# Patient Record
Sex: Male | Born: 2000
Health system: Southern US, Community
[De-identification: ages and names within clinical notes are randomized; demographics above are authoritative.]

## PROBLEM LIST (undated history)

## (undated) DIAGNOSIS — Z21 Asymptomatic human immunodeficiency virus [HIV] infection status: Secondary | ICD-10-CM

## (undated) DIAGNOSIS — B20 Human immunodeficiency virus [HIV] disease: Secondary | ICD-10-CM

---
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
## 2018-01-05 ENCOUNTER — Ambulatory Visit: Payer: Medicaid Other | Admitting: Licensed Clinical Social Worker

## 2018-01-05 DIAGNOSIS — F32A Depression, unspecified: Secondary | ICD-10-CM

## 2018-01-05 DIAGNOSIS — F329 Major depressive disorder, single episode, unspecified: Secondary | ICD-10-CM

## 2018-01-05 NOTE — Progress Notes (Signed)
Type of Service: Integrated Behavioral Health 1 of 6 F/U Visits Total time:30 minutes :  Interpreter:No.    Reason for follow-up: Continue brief intervention to assist patient with managing symptoms of anxiety and depression, as well as work on behavioral change . Reports somewhat difficulty in daily functions. Patient is pleasant and engaged in Rad[place]con[place]ation.   Appearance:Neat ; Thought process: Coherent; Affect: Appropriate: No plan to harm self or others  Patient is making progress towards goal.  States he is feeling a little better and has decrease changes in his behavior to once per day. This is also evident by slight decrease in PHQ-9 score.  Patient continues to have difficulty with sleep, poor appetite, low energy and difficulty relaxing.  States he has implemented relaxed breathing which has helped a lot. GAD 7 : Generalized Anxiety Score 01/05/2018  Nervous, Anxious, on Edge 2  Control/stop worrying 3  Worry too much - different things 3  Trouble relaxing 3  Restless 2  Easily annoyed or irritable 2  Afraid - awful might happen 3  Total GAD 7 Score 18  Anxiety Difficulty Somewhat difficult   [name] 01/05/2018 12/29/2017 12/29/2017  Decreased Interest 2 2 0  Down, Depressed, Hopeless 2 3 0  PHQ - 2 Score 4 5 0  Altered sleeping 3 3 -  Tired, decreased energy 3 3 -  Change in appetite 3 2 -  Feeling bad or failure about yourself  2 3 -  Trouble concentrating 2 2 -  Moving slowly or fidgety/restless 1 2 -  Suicidal thoughts 0 0 -  PHQ-9 Score 18 20 -  Difficult doing work/chores Somewhat difficult Somewhat difficult -   Goals: Patient will  1. reduce symptoms of: anxiety, depression and mood instability ,  2. increase  ability RU:[id number]:coping skills, healthy habits, self-management skills and stress reduction,  3. Decrease self-medicating behaviors. 4. Will start exercise program Intervention: Solution-Focused Strategies, Behavioral Activation and Sleep Hygiene,  Reflective listening, Behavioral Therapy (Relaxed breathing); Psychoeducation . Consult with PCP for possible melatonin to assist with sleep concerns. Issues discussed: goals accomplished from previous visit  ; how change impacts behavior ; how he is able to eat more; sleeping patterns and review of sleep hygiene check list. Assessment:Patient continues to experience symptoms of anxiety and depression.  Symptoms exacerbated by self-medicating behavior, and difficulty with managing anxiety.  Patient may benefit from, and is in agreement to continue further assessment and brief therapeutic interventions to assist with managing his symptoms and behavior change.  Plan: LCSW will continue brief intervention during next office visit. Will adjust interventions based on progress. Patient will work on the plan below prior to next office visit.  1. Patient will F/U with LCSW in 3 weeks and keep appointment with PCP. 2. Behavioral recommendations: continue relaxed breathing, start sleep hygiene, and continue with reduction in behavior change. 3. Referral: none at this time  Sammuel Hineseborah Moore, LCSW Licensed Clinical Social Worker [facility]   [phone] 10:51 AM

## 2018-01-25 ENCOUNTER — Encounter: Payer: Self-pay | Admitting: Family Medicine

## 2018-01-25 ENCOUNTER — Ambulatory Visit: Payer: Medicaid Other

## 2018-01-26 ENCOUNTER — Ambulatory Visit: Payer: Medicaid Other

## 2018-02-11 ENCOUNTER — Encounter (HOSPITAL_COMMUNITY): Payer: Self-pay | Admitting: Emergency Medicine

## 2018-02-11 ENCOUNTER — Ambulatory Visit (HOSPITAL_COMMUNITY)
Admission: EM | Admit: 2018-02-11 | Discharge: 2018-02-11 | Disposition: A | Discharge status: Home / Self Care | Payer: Medicaid Other | Attending: Internal Medicine | Admitting: Internal Medicine

## 2018-02-11 DIAGNOSIS — R1110 Vomiting, unspecified: Secondary | ICD-10-CM | POA: Diagnosis not present

## 2018-02-11 DIAGNOSIS — E860 Dehydration: Secondary | ICD-10-CM | POA: Insufficient documentation

## 2018-02-11 DIAGNOSIS — R1115 Cyclical vomiting syndrome unrelated to migraine: Secondary | ICD-10-CM

## 2018-02-11 LAB — COMPREHENSIVE METABOLIC PANEL
ALT: 20 U/L (ref 0–44)
ANION GAP: 11 (ref 5–15)
AST: 26 U/L (ref 15–41)
Albumin: 4.4 g/dL (ref 3.5–5.0)
Alkaline Phosphatase: 53 U/L (ref 38–126)
BILIRUBIN TOTAL: 1.4 mg/dL — AB (ref 0.3–1.2)
BUN: 13 mg/dL (ref 6–20)
CALCIUM: 9.3 mg/dL (ref 8.9–10.3)
CO2: 21 mmol/L — ABNORMAL LOW (ref 22–32)
Chloride: 111 mmol/L (ref 98–111)
Creatinine, Ser: 1.06 mg/dL (ref 0.61–1.24)
GFR calc Af Amer: >60 mL/min (ref >60)
GFR calc non Af Amer: >60 mL/min (ref >60)
Glucose, Bld: 99 mg/dL (ref 70–99)
POTASSIUM: 3.5 mmol/L (ref 3.5–5.1)
Sodium: 143 mmol/L (ref 135–145)
Total Protein: 7.2 g/dL (ref 6.5–8.1)

## 2018-02-11 LAB — CBC WITH DIFFERENTIAL/PLATELET
Basophils Absolute: 0 10*3/uL (ref 0.0–0.1)
Basophils Relative: 0 %
EOS PCT: 0 %
Eosinophils Absolute: 0 10*3/uL (ref 0.0–0.7)
HEMATOCRIT: 45 % (ref 39.0–52.0)
Hemoglobin: 14.4 g/dL (ref 13.0–17.0)
LYMPHS ABS: 2.4 10*3/uL (ref 0.7–4.0)
Lymphocytes Relative: 45 %
MCH: 26 pg (ref 26.0–34.0)
MCHC: 32 g/dL (ref 30.0–36.0)
MCV: 81.2 fL (ref 78.0–100.0)
MONOS PCT: 8 %
Monocytes Absolute: 0.4 10*3/uL (ref 0.1–1.0)
NEUTROS ABS: 2.5 10*3/uL (ref 1.7–7.7)
Neutrophils Relative %: 47 %
Platelets: 121 10*3/uL — ABNORMAL LOW (ref 150–400)
RBC: 5.54 MIL/uL (ref 4.22–5.81)
RDW: 13.1 % (ref 11.5–15.5)
WBC: 5.3 10*3/uL (ref 4.0–10.5)

## 2018-02-11 LAB — LIPASE, BLOOD: Lipase: 25 U/L (ref 11–51)

## 2018-02-11 LAB — AMYLASE: Amylase: 37 U/L (ref 28–100)

## 2018-02-11 MED ORDER — ONDANSETRON HCL 4 MG/2ML IJ SOLN
4.0000 mg | Freq: Once | INTRAMUSCULAR | Status: AC
  Administered 2018-02-11: 4 mg via INTRAVENOUS

## 2018-02-11 MED ORDER — METOCLOPRAMIDE HCL 5 MG/ML IJ SOLN
INTRAMUSCULAR | Status: AC
  Filled 2018-02-11: qty 2

## 2018-02-11 MED ORDER — METOCLOPRAMIDE HCL 5 MG/ML IJ SOLN
10.0000 mg | Freq: Once | INTRAMUSCULAR | Status: AC
  Administered 2018-02-11: 10 mg via INTRAVENOUS

## 2018-02-11 MED ORDER — ONDANSETRON HCL 4 MG/2ML IJ SOLN
INTRAMUSCULAR | Status: AC
  Filled 2018-02-11: qty 2

## 2018-02-11 MED ORDER — SODIUM CHLORIDE 0.9 % IV BOLUS
1000.0000 mL | Freq: Once | INTRAVENOUS | Status: AC
  Administered 2018-02-11: 1000 mL via INTRAVENOUS

## 2018-02-11 NOTE — ED Provider Notes (Signed)
[facility]    CSN: [id number] Arrival date & time: 02/11/18  1224     History   Chief Complaint Chief Complaint  Patient presents with  . Emesis    HPI Matthew Petersen is a 17 y.o. male.   He presents today with 3-day history of vomiting, upper abdominal discomfort, seems to radiate to the left flank/abdomen.  Feels sore from throwing up.  Central chest discomfort after vomiting.  Not having diarrhea.  No hematuria.  Vomiting during exam.  Similar symptoms a month ago, seen in ED for this, differential dx included cannabinoid hyperemesis.  Acknowledges smoking weed, denies vaping. Some runny nose, equivocal cough, no sore throat.  Not febrile.  Slightly diaphoretic.  HPI  History reviewed. No pertinent past medical history.  Patient Active Problem List   Diagnosis Date Noted  . Mood disorder of depressed type 12/29/2017  . Loss of weight 12/29/2017  . Exposure to STD 12/22/2017    History reviewed. No pertinent surgical history.     Home Medications    Prior to Admission medications   Medication Sig Start Date End Date Taking? Authorizing Provider  capsicum (ZOSTRIX) 0.075 % topical cream Apply 1 application topically 2 (two) times daily as needed (pain).    [provider]  diphenhydramine-acetaminophen (TYLENOL PM) 25-500 MG TABS tablet Take 2 tablets by mouth at bedtime as needed (sleep/pain).    [provider]  ondansetron (ZOFRAN) 4 MG tablet Take 1 tablet (4 mg total) by mouth every 8 (eight) hours as needed for up to 10 days for nausea or vomiting. 02/12/18 02/22/18  Mortis, Jerrel Ivory I, PA-C  potassium chloride SA (K-DUR,KLOR-CON) 20 MEQ tablet Take 1 tablet (20 mEq total) by mouth daily. 02/12/18 03/14/18  Mortis, Sharyon Medicus, PA-C    Family History No family history on file.  Social History Social History   Tobacco Use  . Smoking status: Smoker, Current Status Unknown  . Smokeless tobacco: Never Used  Substance Use Topics    . Alcohol use: Yes    Comment: 12/27/2017 last usage  . Drug use: Yes    Types: Marijuana    Comment: last used 02/05/2018     Allergies   Patient has no known allergies.   Review of Systems Review of Systems  All other systems reviewed and are negative.    Physical Exam Triage Vital Signs ED Triage Vitals [02/11/18 1250]  Enc Vitals Group     BP 132/74     Pulse Rate 60     Resp 18     Temp 98.3 F (36.8 C)     Temp src      SpO2 100 %     Weight      Height      Pain Score      Pain Loc    Updated Vital Signs BP 132/74   Pulse 60   Temp 98.3 F (36.8 C)   Resp 18   SpO2 100%  Physical Exam  Constitutional: He is oriented to person, place, and time.  Alert, miserable Frequent hard vomiting/retching   HENT:  Head: Atraumatic.  Eyes:  Conjugate gaze, no eye redness/drainage  Neck: Neck supple.  Cardiovascular: Normal rate and regular rhythm.  Pulmonary/Chest: No respiratory distress. He has no wheezes. He has no rales.  Lungs clear, symmetric breath sounds  Abdominal: Soft.  Mod tenderness in epigastrium No rebound, does not guard Pain radiates to L abdomen/flank but no focal tenderness on the left.  Musculoskeletal: Normal range of motion.  Neurological: He is alert and oriented to person, place, and time.  Skin: Skin is warm and dry.  No cyanosis  Nursing note and vitals reviewed.    UC Treatments / Results  Labs Results for orders placed or performed during the hospital encounter of 02/11/18  CBC with Differential  Result Value Ref Range   WBC 5.3 4.0 - 10.5 K/uL   RBC 5.54 4.22 - 5.81 MIL/uL   Hemoglobin 14.4 13.0 - 17.0 g/dL   HCT 96.045.0 45.439.0 - 09.852.0 %   MCV 81.2 78.0 - 100.0 fL   MCH 26.0 26.0 - 34.0 pg   MCHC 32.0 30.0 - 36.0 g/dL   RDW 11.913.1 14.711.5 - 82.915.5 %   Platelets 121 (L) 150 - 400 K/uL   Neutrophils Relative % 47 %   Lymphocytes Relative 45 %   Monocytes Relative 8 %   Eosinophils Relative 0 %   Basophils Relative 0 %    Neutro Abs 2.5 1.7 - 7.7 K/uL   Lymphs Abs 2.4 0.7 - 4.0 K/uL   Monocytes Absolute 0.4 0.1 - 1.0 K/uL   Eosinophils Absolute 0.0 0.0 - 0.7 K/uL   Basophils Absolute 0.0 0.0 - 0.1 K/uL   WBC Morphology ATYPICAL LYMPHOCYTES   Comprehensive metabolic panel  Result Value Ref Range   Sodium 143 135 - 145 mmol/L   Potassium 3.5 3.5 - 5.1 mmol/L   Chloride 111 98 - 111 mmol/L   CO2 21 (L) 22 - 32 mmol/L   Glucose, Bld 99 70 - 99 mg/dL   BUN 13 6 - 20 mg/dL   Creatinine, Ser 5.621.06 0.61 - 1.24 mg/dL   Calcium 9.3 8.9 - 13.010.3 mg/dL   Total Protein 7.2 6.5 - 8.1 g/dL   Albumin 4.4 3.5 - 5.0 g/dL   AST 26 15 - 41 U/L   ALT 20 0 - 44 U/L   Alkaline Phosphatase 53 38 - 126 U/L   Total Bilirubin 1.4 (H) 0.3 - 1.2 mg/dL   GFR calc non Af Amer >60 >60 mL/min   GFR calc Af Amer >60 >60 mL/min   Anion gap 11 5 - 15  Amylase  Result Value Ref Range   Amylase 37 28 - 100 U/L  Lipase, blood  Result Value Ref Range   Lipase 25 11 - 51 U/L    Procedures Procedures (including critical care time)  Medications Ordered in UC Medications  ondansetron (ZOFRAN) injection 4 mg (4 mg Intravenous Given 02/11/18 1305)  sodium chloride 0.9 % bolus 1,000 mL (0 mLs Intravenous Stopped 02/11/18 1514)  metoCLOPramide (REGLAN) injection 10 mg (10 mg Intravenous Given 02/11/18 1403)    Final Clinical Impressions(s) / UC Diagnoses   Final diagnoses:  Emesis, persistent  Dehydration     Discharge Instructions     500cc iv fluid were given in the urgent care today.  Doses of iv reglan and zofran were given, without relief of vomiting.  Lab work today including blood counts, liver/kidney/pancreas tests, chemistries, is unremarkable, and does not suggest any particular cause for the vomiting.  Please push fluids.  Recheck or go to the ER for further evaluation/treatment if vomiting continues.  Exposures to various toxic substances including alcohol and marijuana-like products can cause vomiting--please avoid  exposing yourself these.      Isa RankinMurray, Felecia Stanfill Wilson, MD 02/15/18 1524

## 2018-02-11 NOTE — Discharge Instructions (Addendum)
500cc iv fluid were given in the urgent care today.  Doses of iv reglan and zofran were given, without relief of vomiting.  Lab work today including blood counts, liver/kidney/pancreas tests, chemistries, is unremarkable, and does not suggest any particular cause for the vomiting.  Please push fluids.  Recheck or go to the ER for further evaluation/treatment if vomiting continues.  Exposures to various toxic substances including alcohol and marijuana-like products can cause vomiting--please avoid exposing yourself these.

## 2018-02-11 NOTE — ED Notes (Signed)
Patient unable to void at this time

## 2018-02-11 NOTE — ED Notes (Signed)
Patient did not provide a urine specimen

## 2018-02-11 NOTE — ED Triage Notes (Signed)
Pt c/o vomiting and nausea x3 days.

## 2018-02-12 ENCOUNTER — Encounter (HOSPITAL_COMMUNITY): Payer: Self-pay | Admitting: Emergency Medicine

## 2018-02-12 ENCOUNTER — Emergency Department (HOSPITAL_COMMUNITY)
Admission: EM | Admit: 2018-02-12 | Discharge: 2018-02-12 | Disposition: A | Discharge status: Home / Self Care | Payer: Medicaid Other | Attending: Emergency Medicine | Admitting: Emergency Medicine

## 2018-02-12 ENCOUNTER — Telehealth (HOSPITAL_COMMUNITY): Payer: Self-pay | Admitting: Emergency Medicine

## 2018-02-12 ENCOUNTER — Other Ambulatory Visit: Payer: Self-pay

## 2018-02-12 DIAGNOSIS — Z79899 Other long term (current) drug therapy: Secondary | ICD-10-CM | POA: Insufficient documentation

## 2018-02-12 DIAGNOSIS — E876 Hypokalemia: Secondary | ICD-10-CM | POA: Insufficient documentation

## 2018-02-12 DIAGNOSIS — R112 Nausea with vomiting, unspecified: Secondary | ICD-10-CM | POA: Insufficient documentation

## 2018-02-12 LAB — COMPREHENSIVE METABOLIC PANEL
ALK PHOS: 50 U/L (ref 38–126)
ALT: 26 U/L (ref 0–44)
AST: 29 U/L (ref 15–41)
Albumin: 5.1 g/dL — ABNORMAL HIGH (ref 3.5–5.0)
Anion gap: 12 (ref 5–15)
BUN: 20 mg/dL (ref 6–20)
CHLORIDE: 109 mmol/L (ref 98–111)
CO2: 25 mmol/L (ref 22–32)
CREATININE: 0.97 mg/dL (ref 0.61–1.24)
Calcium: 9.6 mg/dL (ref 8.9–10.3)
GFR calc non Af Amer: >60 mL/min (ref >60)
Glucose, Bld: 101 mg/dL — ABNORMAL HIGH (ref 70–99)
Potassium: 3.2 mmol/L — ABNORMAL LOW (ref 3.5–5.1)
Sodium: 146 mmol/L — ABNORMAL HIGH (ref 135–145)
Total Bilirubin: 1 mg/dL (ref 0.3–1.2)
Total Protein: 8 g/dL (ref 6.5–8.1)

## 2018-02-12 LAB — URINALYSIS, ROUTINE W REFLEX MICROSCOPIC
Bilirubin Urine: NEGATIVE
Glucose, UA: NEGATIVE mg/dL
Hgb urine dipstick: NEGATIVE
KETONES UR: 80 mg/dL — AB
LEUKOCYTES UA: NEGATIVE
Nitrite: NEGATIVE
Protein, ur: 100 mg/dL — AB
Specific Gravity, Urine: 1.038 — ABNORMAL HIGH (ref 1.005–1.030)
pH: 6 (ref 5.0–8.0)

## 2018-02-12 LAB — CBC WITH DIFFERENTIAL/PLATELET
BASOS PCT: 0 %
Basophils Absolute: 0 10*3/uL (ref 0.0–0.1)
EOS PCT: 0 %
Eosinophils Absolute: 0 10*3/uL (ref 0.0–0.7)
HCT: 42.3 % (ref 39.0–52.0)
HEMOGLOBIN: 14.3 g/dL (ref 13.0–17.0)
LYMPHS PCT: 33 %
Lymphs Abs: 1.5 10*3/uL (ref 0.7–4.0)
MCH: 26.7 pg (ref 26.0–34.0)
MCHC: 33.8 g/dL (ref 30.0–36.0)
MCV: 79.1 fL (ref 78.0–100.0)
Monocytes Absolute: 0.4 10*3/uL (ref 0.1–1.0)
Monocytes Relative: 10 %
Neutro Abs: 2.6 10*3/uL (ref 1.7–7.7)
Neutrophils Relative %: 57 %
PLATELETS: 153 10*3/uL (ref 150–400)
RBC: 5.35 MIL/uL (ref 4.22–5.81)
RDW: 13.5 % (ref 11.5–15.5)
WBC: 4.5 10*3/uL (ref 4.0–10.5)

## 2018-02-12 LAB — LIPASE, BLOOD: Lipase: 22 U/L (ref 11–51)

## 2018-02-12 MED ORDER — ONDANSETRON HCL 4 MG PO TABS: 4.0000 mg | ORAL_TABLET | Freq: Three times a day (TID) | ORAL | 0 refills | Status: AC | PRN

## 2018-02-12 MED ORDER — SODIUM CHLORIDE 0.9 % IV BOLUS
1000.0000 mL | Freq: Once | INTRAVENOUS | Status: AC
  Administered 2018-02-12: 1000 mL via INTRAVENOUS

## 2018-02-12 MED ORDER — METOCLOPRAMIDE HCL 5 MG/ML IJ SOLN
10.0000 mg | Freq: Once | INTRAMUSCULAR | Status: AC
  Administered 2018-02-12: 10 mg via INTRAVENOUS
  Filled 2018-02-12: qty 2

## 2018-02-12 MED ORDER — POTASSIUM CHLORIDE CRYS ER 20 MEQ PO TBCR: 20.0000 meq | EXTENDED_RELEASE_TABLET | Freq: Every day | ORAL | 0 refills | Status: DC

## 2018-02-12 NOTE — Telephone Encounter (Signed)
Pt called with mother on the line saying on the discharge paperwork said he had a prescription for zofran. Pts paperwork did not have any prescription. Since patient did not let me go over paperwork with him when he left yesterday, I read his paperwork back to him which stated if he continued to vomit, he needed to be reevaluated in the ER. Pt hung up on me.

## 2018-02-12 NOTE — Discharge Instructions (Addendum)
Your lab work looked pretty good. Your potassium was a little low at 3.2 so I have prescribed you a potassium supplement to take daily. Follow-up with your PCP to get this re-checked in the future.  I have prescribed you some medication (Zofran) for the nausea/vomiting that you can take. Continue to drink as much fluid on your own as you can to stay hydrated.  Thank you for allowing me to take care of you today! 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

## 2018-02-12 NOTE — ED Triage Notes (Signed)
Pt c/o ongoing nausea and vomiting. Pt states he was seen in UC yesterday and given meds and fluids, pt states he was not sent home with any Rx, pt continues with symptoms and c/o feeling like he is dehydrated.

## 2018-02-12 NOTE — ED Provider Notes (Signed)
[facility] Provider Note  CSN: [id number] Arrival date & time: 02/12/18  1407  History   Chief Complaint Chief Complaint  Patient presents with  . Emesis  . Dehydration    HPI Matthew Petersen is a 17 y.o. male with no significant medical history who presented to the ED for vomiting x3 days. Associated symptoms: abdominal discomfort and decreased appetite. Patient reports that vomiting has been an intermittent issue for him since 12/27/17 and has lost 10+lbs since then. Denies recent travel, new food exposures or recent illnesses. Patient admits to daily cannabis use and occasional EtOH use. Denies fever, chills, diarrhea/constipation, blood in stool or dark stools, dysuria or hematuria. Patient states he has not taken any medications at home for vomiting and reports going to urgent care on 02/11/18 where he received antiemetics and IV fluids.  History reviewed. No pertinent past medical history.  Patient Active Problem List   Diagnosis Date Noted  . Mood disorder of depressed type 12/29/2017  . Loss of weight 12/29/2017  . Exposure to STD 12/22/2017    History reviewed. No pertinent surgical history.      Home Medications    Prior to Admission medications   Medication Sig Start Date End Date Taking? Authorizing Provider  capsicum (ZOSTRIX) 0.075 % topical cream Apply 1 application topically 2 (two) times daily as needed (pain).   Yes [provider]  diphenhydramine-acetaminophen (TYLENOL PM) 25-500 MG TABS tablet Take 2 tablets by mouth at bedtime as needed (sleep/pain).   Yes [provider]  ondansetron (ZOFRAN) 4 MG tablet Take 1 tablet (4 mg total) by mouth every 8 (eight) hours as needed for up to 10 days for nausea or vomiting. 02/12/18 02/22/18  Mortis, Jerrel Ivory I, PA-C  potassium chloride SA (K-DUR,KLOR-CON) 20 MEQ tablet Take 1 tablet (20 mEq total) by mouth daily. 02/12/18 03/14/18  Mortis, Sharyon Medicus, PA-C     Family History History reviewed. No pertinent family history.  Social History Social History   Tobacco Use  . Smoking status: Smoker, Current Status Unknown  . Smokeless tobacco: Never Used  Substance Use Topics  . Alcohol use: Yes    Comment: 12/27/2017 last usage  . Drug use: Yes    Types: Marijuana    Comment: last used 02/05/2018     Allergies   Patient has no known allergies.   Review of Systems Review of Systems  Constitutional: Negative for chills and fever.  Respiratory: Negative.   Cardiovascular: Negative.   Gastrointestinal: Positive for abdominal pain, nausea and vomiting. Negative for constipation and diarrhea.  Genitourinary: Negative.   Musculoskeletal: Negative.   Skin: Negative.    Physical Exam Updated Vital Signs BP 125/69 (BP Location: Right Arm)   Pulse (!) 50   Temp (!) 97.5 F (36.4 C) (Oral)   Resp 16   SpO2 100%   Physical Exam  Constitutional: He appears well-developed and well-nourished.  HENT:  Mouth/Throat: Uvula is midline, oropharynx is clear and moist and mucous membranes are normal.  Neck: Normal range of motion and full passive range of motion without pain. Neck supple.  Cardiovascular: Normal rate, regular rhythm and normal heart sounds.  Pulmonary/Chest: Effort normal and breath sounds normal.  Abdominal: Soft. Normal appearance and bowel sounds are normal. There is no rigidity and no guarding.  Lower abdomen discomfort with palpation.  Skin: Skin is warm and intact. Capillary refill takes less than 2 seconds.  Nursing note and vitals reviewed.  ED Treatments / Results  Labs (all labs ordered are listed, but only abnormal results are displayed) Labs Reviewed  COMPREHENSIVE METABOLIC PANEL - Abnormal; Notable for the following components:      Result Value   Sodium 146 (*)    Potassium 3.2 (*)    Glucose, Bld 101 (*)    Albumin 5.1 (*)    All other components within normal limits  CBC WITH DIFFERENTIAL/PLATELET   LIPASE, BLOOD  URINALYSIS, ROUTINE W REFLEX MICROSCOPIC    EKG None  Radiology No results found.  Procedures Procedures (including critical care time)  Medications Ordered in ED Medications  metoCLOPramide (REGLAN) injection 10 mg (10 mg Intravenous Given 02/12/18 1649)  sodium chloride 0.9 % bolus 1,000 mL (0 mLs Intravenous Stopped 02/12/18 1841)     Initial Impression / Assessment and Plan / ED Course  Triage vital signs and the nursing notes have been reviewed.  Pertinent labs & imaging results that were available during care of the patient were reviewed and considered in medical decision making (see chart for details).  Patient presents for N/V x3 days which appears to occur in the context of daily excessive cannabis use. Patient has no other risk factors that would suggest an infectious, metabolic or mechanical etiology to symptoms. Physical exam is unremarkable. No indication for abdominal imaging. Will order lab work to evaluate for any electrolyte abnormalities that could come from patient's GI losses. Will administer IV fluids and antiemetic for rehydration and acute nausea relief.  Clinical Course as of Feb 12 1858  Sat Feb 12, 2018  1817 Hypokalemic at 3.2. Will order EKG to evaluate for associated changes. Will order PO supplement and plans to discharge with Rx.   [GM]  1853 EKG showed NSR. No T wave abnormalites. QTc 451.   [GM]    Clinical Course User Index [GM] Mortis, Sharyon MedicusGabrielle I, PA-C    Final Clinical Impressions(s) / ED Diagnoses  1. Vomiting. Likely cannabis induced. Rx for Zofran. Education provided on cannabis use and encouraged cessation.  2. Hypokalemia. Potassium 3.2. No associated EKG changes or s/s. Rx for PO potassium supplement.  Dispo: Home. After thorough clinical evaluation, this patient is determined to be medically stable and can be safely discharged with the previously mentioned treatment and/or outpatient follow-up/referral(s). At this  time, there are no other apparent medical conditions that require further screening, evaluation or treatment.   Final diagnoses:  Non-intractable vomiting with nausea, unspecified vomiting type  Hypokalemia    ED Discharge Orders         Ordered    potassium chloride SA (K-DUR,KLOR-CON) 20 MEQ tablet  Daily     02/12/18 1856    ondansetron (ZOFRAN) 4 MG tablet  Every 8 hours PRN     02/12/18 1856            Windy CarinaMortis, Gabrielle I, PA-C 02/12/18 1859    Wynetta FinesMessick, Peter C, MD 02/14/18 [phone]

## 2018-02-16 ENCOUNTER — Emergency Department (HOSPITAL_COMMUNITY)
Admission: EM | Admit: 2018-02-16 | Discharge: 2018-02-16 | Disposition: A | Discharge status: Home / Self Care | Payer: Medicaid Other | Attending: Emergency Medicine | Admitting: Emergency Medicine

## 2018-02-16 ENCOUNTER — Encounter (HOSPITAL_COMMUNITY): Payer: Self-pay | Admitting: Emergency Medicine

## 2018-02-16 ENCOUNTER — Ambulatory Visit (HOSPITAL_COMMUNITY): Payer: Medicaid Other

## 2018-02-16 ENCOUNTER — Ambulatory Visit: Payer: Medicaid Other | Admitting: Family Medicine

## 2018-02-16 DIAGNOSIS — R112 Nausea with vomiting, unspecified: Secondary | ICD-10-CM | POA: Insufficient documentation

## 2018-02-16 DIAGNOSIS — R1012 Left upper quadrant pain: Secondary | ICD-10-CM | POA: Insufficient documentation

## 2018-02-16 LAB — CBC
HCT: 57.4 % — ABNORMAL HIGH (ref 39.0–52.0)
Hemoglobin: 17.4 g/dL — ABNORMAL HIGH (ref 13.0–17.0)
MCH: 26.1 pg (ref 26.0–34.0)
MCHC: 30.3 g/dL (ref 30.0–36.0)
MCV: 86.2 fL (ref 78.0–100.0)
Platelets: 231 10*3/uL (ref 150–400)
RBC: 6.66 MIL/uL — ABNORMAL HIGH (ref 4.22–5.81)
RDW: 13.6 % (ref 11.5–15.5)
WBC: 5.9 10*3/uL (ref 4.0–10.5)

## 2018-02-16 LAB — URINALYSIS, ROUTINE W REFLEX MICROSCOPIC
BILIRUBIN URINE: NEGATIVE
Glucose, UA: NEGATIVE mg/dL
HGB URINE DIPSTICK: NEGATIVE
Ketones, ur: 80 mg/dL — AB
Leukocytes, UA: NEGATIVE
NITRITE: NEGATIVE
PH: 6 (ref 5.0–8.0)
Protein, ur: 30 mg/dL — AB
SPECIFIC GRAVITY, URINE: 1.032 — AB (ref 1.005–1.030)

## 2018-02-16 LAB — COMPREHENSIVE METABOLIC PANEL
ALBUMIN: 4.9 g/dL (ref 3.5–5.0)
ALK PHOS: 57 U/L (ref 38–126)
ALT: 23 U/L (ref 0–44)
ANION GAP: 15 (ref 5–15)
AST: 21 U/L (ref 15–41)
BILIRUBIN TOTAL: 2.1 mg/dL — AB (ref 0.3–1.2)
BUN: 13 mg/dL (ref 6–20)
CALCIUM: 9.9 mg/dL (ref 8.9–10.3)
CO2: 24 mmol/L (ref 22–32)
Chloride: 100 mmol/L (ref 98–111)
Creatinine, Ser: 1.13 mg/dL (ref 0.61–1.24)
GFR calc Af Amer: >60 mL/min (ref >60)
GFR calc non Af Amer: >60 mL/min (ref >60)
GLUCOSE: 92 mg/dL (ref 70–99)
Potassium: 3 mmol/L — ABNORMAL LOW (ref 3.5–5.1)
Sodium: 139 mmol/L (ref 135–145)
TOTAL PROTEIN: 8.2 g/dL — AB (ref 6.5–8.1)

## 2018-02-16 LAB — LIPASE, BLOOD: Lipase: 29 U/L (ref 11–51)

## 2018-02-16 MED ORDER — POTASSIUM CHLORIDE 10 MEQ/100ML IV SOLN
10.0000 meq | INTRAVENOUS | Status: AC
  Administered 2018-02-16 (×2): 10 meq via INTRAVENOUS
  Filled 2018-02-16 (×2): qty 100

## 2018-02-16 MED ORDER — GI COCKTAIL ~~LOC~~
30.0000 mL | Freq: Once | ORAL | Status: AC
  Administered 2018-02-16: 30 mL via ORAL
  Filled 2018-02-16: qty 30

## 2018-02-16 MED ORDER — POTASSIUM CHLORIDE CRYS ER 20 MEQ PO TBCR: 20.0000 meq | EXTENDED_RELEASE_TABLET | Freq: Every day | ORAL | 0 refills | Status: DC

## 2018-02-16 MED ORDER — LANSOPRAZOLE 30 MG PO CPDR: 30.0000 mg | DELAYED_RELEASE_CAPSULE | Freq: Every day | ORAL | 0 refills | Status: DC

## 2018-02-16 MED ORDER — ONDANSETRON 4 MG PO TBDP: ORAL_TABLET | ORAL | 0 refills | Status: DC

## 2018-02-16 MED ORDER — METOCLOPRAMIDE HCL 5 MG/ML IJ SOLN
10.0000 mg | Freq: Once | INTRAMUSCULAR | Status: AC
  Administered 2018-02-16: 10 mg via INTRAVENOUS
  Filled 2018-02-16: qty 2

## 2018-02-16 MED ORDER — SODIUM CHLORIDE 0.9 % IV BOLUS
2000.0000 mL | Freq: Once | INTRAVENOUS | Status: AC
  Administered 2018-02-16: 2000 mL via INTRAVENOUS

## 2018-02-16 MED ORDER — ONDANSETRON 4 MG PO TBDP
4.0000 mg | ORAL_TABLET | Freq: Once | ORAL | Status: AC | PRN
  Administered 2018-02-16: 4 mg via ORAL
  Filled 2018-02-16: qty 1

## 2018-02-16 NOTE — ED Triage Notes (Signed)
Pt to ER for persistent nausea and vomiting x5 days, states was seen recently for same and given medications to take at home but has not been able to "keep anything down." reports LUQ abd pain/epigastric discomfort, onset after vomiting began. Pt in NAD at this time. Ambulatory. Mother at bedside states "he's lost a solid 40 lbs since Friday."

## 2018-02-16 NOTE — ED Notes (Signed)
Patient transported to Ultrasound 

## 2018-02-16 NOTE — ED Notes (Signed)
Patient verbalizes understanding of discharge instructions. Opportunity for questioning and answers were provided. Armband removed by staff, pt discharged from ED ambulatory with mother. Pt admitted to using marijuana when mother was not in room; educated on probable cause for n/v and educated on abstinence.

## 2018-02-16 NOTE — ED Provider Notes (Signed)
[facility] EMERGENCY DEPARTMENT Provider Note   CSN: [id number] Arrival date & time: 02/16/18  1105     History   Chief Complaint Chief Complaint  Patient presents with  . Abdominal Pain    HPI Matthew Petersen is a 17 y.o. male.  HPI Patient states for the past 5 days he has been having upper abdominal pain associated with multiple episodes of vomiting.  Thinks he is vomiting roughly 20 times a day.  Unable to keep liquids and solids down.  Had a small bowel movement yesterday.  No grossly bloody or melanotic stools.  No fever or chills.  Last used marijuana 5 days ago.  Denies any NSAID use.  Has been seen multiple times for similar symptoms.  Has not seen a gastroenterologist. History reviewed. No pertinent past medical history.  Patient Active Problem List   Diagnosis Date Noted  . Mood disorder of depressed type 12/29/2017  . Loss of weight 12/29/2017  . Exposure to STD 12/22/2017    History reviewed. No pertinent surgical history.      Home Medications    Prior to Admission medications   Medication Sig Start Date End Date Taking? Authorizing Provider  capsicum (ZOSTRIX) 0.075 % topical cream Apply 1 application topically 2 (two) times daily as needed (pain).    [provider]  diphenhydramine-acetaminophen (TYLENOL PM) 25-500 MG TABS tablet Take 2 tablets by mouth at bedtime as needed (sleep/pain).    [provider]  lansoprazole (PREVACID) 30 MG capsule Take 1 capsule (30 mg total) by mouth daily at 12 noon. 02/16/18   Loren Racer, MD  ondansetron (ZOFRAN ODT) 4 MG disintegrating tablet 4mg  ODT q4 hours prn nausea/vomit 02/16/18   Loren Racer, MD  ondansetron (ZOFRAN) 4 MG tablet Take 1 tablet (4 mg total) by mouth every 8 (eight) hours as needed for up to 10 days for nausea or vomiting. 02/12/18 02/22/18  Mortis, Jerrel Ivory I, PA-C  potassium chloride SA (K-DUR,KLOR-CON) 20 MEQ tablet Take 1 tablet (20 mEq total) by mouth  daily. 02/17/18   Loren Racer, MD    Family History History reviewed. No pertinent family history.  Social History Social History   Tobacco Use  . Smoking status: Smoker, Current Status Unknown  . Smokeless tobacco: Never Used  Substance Use Topics  . Alcohol use: Yes    Comment: 12/27/2017 last usage  . Drug use: Yes    Types: Marijuana    Comment: last used 02/05/2018     Allergies   Patient has no known allergies.   Review of Systems Review of Systems  Constitutional: Positive for fatigue. Negative for chills and fever.  HENT: Negative for sore throat and trouble swallowing.   Eyes: Negative for visual disturbance.  Respiratory: Negative for cough, chest tightness and shortness of breath.   Cardiovascular: Negative for chest pain and leg swelling.  Gastrointestinal: Positive for abdominal pain, nausea and vomiting. Negative for blood in stool, constipation and diarrhea.  Genitourinary: Negative for dysuria, flank pain, frequency and hematuria.  Musculoskeletal: Negative for back pain, myalgias and neck pain.  Skin: Negative for rash and wound.  Neurological: Negative for dizziness, weakness, light-headedness, numbness and headaches.  All other systems reviewed and are negative.    Physical Exam Updated Vital Signs BP 123/74 (BP Location: Right Arm)   Pulse 85   Temp 99.5 F (37.5 C) (Oral)   Resp 15   SpO2 99%   Physical Exam  Constitutional: He is oriented to person,  place, and time. He appears well-developed and well-nourished. No distress.  HENT:  Head: Normocephalic and atraumatic.  Mouth/Throat: Oropharynx is clear and moist. No oropharyngeal exudate.  Eyes: Pupils are equal, round, and reactive to light. EOM are normal.  Neck: Normal range of motion. Neck supple. No JVD present.  Cardiovascular: Normal rate and regular rhythm. Exam reveals no gallop and no friction rub.  No murmur heard. Pulmonary/Chest: Effort normal and breath sounds normal. No  stridor. No respiratory distress. He has no wheezes. He has no rales. He exhibits no tenderness.  Abdominal: Soft. Bowel sounds are normal. There is tenderness. There is no rebound and no guarding.  Mild left upper quadrant tenderness to palpation.  No rebound or guarding.  Musculoskeletal: Normal range of motion. He exhibits no edema or tenderness.  No midline thoracic or lumbar tenderness.  No CVA tenderness.  No lower extremity swelling, asymmetry or tenderness.  Distal pulses intact.  Lymphadenopathy:    He has no cervical adenopathy.  Neurological: He is alert and oriented to person, place, and time.  Moving all extremities without focal deficit.  Sensation intact.  Skin: Skin is warm and dry. Capillary refill takes less than 2 seconds. No rash noted. He is not diaphoretic. No erythema.  Psychiatric: He has a normal mood and affect. His behavior is normal.  Nursing note and vitals reviewed.    ED Treatments / Results  Labs (all labs ordered are listed, but only abnormal results are displayed) Labs Reviewed  COMPREHENSIVE METABOLIC PANEL - Abnormal; Notable for the following components:      Result Value   Potassium 3.0 (*)    Total Protein 8.2 (*)    Total Bilirubin 2.1 (*)    All other components within normal limits  CBC - Abnormal; Notable for the following components:   RBC 6.66 (*)    Hemoglobin 17.4 (*)    HCT 57.4 (*)    All other components within normal limits  URINALYSIS, ROUTINE W REFLEX MICROSCOPIC - Abnormal; Notable for the following components:   Color, Urine AMBER (*)    APPearance HAZY (*)    Specific Gravity, Urine 1.032 (*)    Ketones, ur 80 (*)    Protein, ur 30 (*)    Bacteria, UA RARE (*)    All other components within normal limits  LIPASE, BLOOD    EKG None  Radiology US Abdomen Limited  Result Date: 02/16/2018 CLINICAL DATA:  Right upper quadrant pain for 6 days. EXAM: ULTRASOUND ABDOMEN LIMITED RIGHT UPPER QUADRANT COMPARISON:  None.  FINDINGS: Gallbladder: No gallstones or wall thickening visualized. No sonographic Murphy sign noted by sonographer. Common bile duct: Diameter: 0.3 cm Liver: No focal lesion identified. Within normal limits in parenchymal echogenicity. Portal vein is patent on color Doppler imaging with normal direction of blood flow towards the liver. IMPRESSION: Negative for gallstones.  Normal exam. Electronically Signed   By: Drusilla Kanner M.D.   On: 02/16/2018 15:43    Procedures Procedures (including critical care time)  Medications Ordered in ED Medications  ondansetron (ZOFRAN-ODT) disintegrating tablet 4 mg (4 mg Oral Given 02/16/18 1139)  sodium chloride 0.9 % bolus 2,000 mL (0 mLs Intravenous Stopped 02/16/18 1540)  gi cocktail (Maalox,Lidocaine,Donnatal) (30 mLs Oral Given 02/16/18 1455)  metoCLOPramide (REGLAN) injection 10 mg (10 mg Intravenous Given 02/16/18 1456)  potassium chloride 10 mEq in 100 mL IVPB (0 mEq Intravenous Stopped 02/16/18 1711)     Initial Impression / Assessment and Plan / ED Course  I have reviewed the triage vital signs and the nursing notes.  Pertinent labs & imaging results that were available during my care of the patient were reviewed by me and considered in my medical decision making (see chart for details).     Patient is feeling much better after IV fluids and medication.  He is tolerating oral intake.  Potassium is being replaced.  Ultrasound abdomen without acute findings.  Anticipate discharge home with PPI and GI follow-up if symptoms persist.  Return precautions given.  Final Clinical Impressions(s) / ED Diagnoses   Final diagnoses:  Left upper quadrant pain  Non-intractable vomiting with nausea, unspecified vomiting type    ED Discharge Orders         Ordered    potassium chloride SA (K-DUR,KLOR-CON) 20 MEQ tablet  Daily     02/16/18 1609    lansoprazole (PREVACID) 30 MG capsule  Daily     02/16/18 1609    ondansetron (ZOFRAN ODT) 4 MG disintegrating  tablet     02/16/18 1609           Loren Racer, MD 02/17/18 [phone]

## 2018-02-16 NOTE — ED Provider Notes (Signed)
I received the patient in signout from Dr. Ranae Palms.  Patient is a 17 year old with a chief complaint of persistent nausea and vomiting.  Thought likely secondary to cannabinoid hyperemesis syndrome.  He was found to be hypokalemic and was given 2 rounds of potassium.  Plan is the discharge post infusion.  Patient reassessed and is still tolerating p.o.  Feels well.  Discharge home.   Melene Plan, DO 02/16/18 [phone]

## 2018-02-16 NOTE — ED Notes (Signed)
PT placed on cardiac monitor.

## 2018-02-16 NOTE — ED Notes (Signed)
Taken to [place] on monitor

## 2018-02-22 ENCOUNTER — Encounter: Payer: Self-pay | Admitting: Gastroenterology

## 2018-02-22 ENCOUNTER — Telehealth: Payer: Self-pay | Admitting: Family Medicine

## 2018-02-22 ENCOUNTER — Other Ambulatory Visit: Payer: Self-pay

## 2018-02-22 ENCOUNTER — Emergency Department (HOSPITAL_COMMUNITY)
Admission: EM | Admit: 2018-02-22 | Discharge: 2018-02-22 | Disposition: A | Discharge status: Home / Self Care | Payer: Medicaid Other | Attending: Emergency Medicine | Admitting: Emergency Medicine

## 2018-02-22 ENCOUNTER — Encounter (HOSPITAL_COMMUNITY): Payer: Self-pay | Admitting: *Deleted

## 2018-02-22 ENCOUNTER — Emergency Department (HOSPITAL_COMMUNITY): Payer: Medicaid Other

## 2018-02-22 DIAGNOSIS — R112 Nausea with vomiting, unspecified: Secondary | ICD-10-CM

## 2018-02-22 DIAGNOSIS — R1114 Bilious vomiting: Secondary | ICD-10-CM | POA: Diagnosis present

## 2018-02-22 DIAGNOSIS — R1110 Vomiting, unspecified: Secondary | ICD-10-CM | POA: Insufficient documentation

## 2018-02-22 LAB — URINALYSIS, ROUTINE W REFLEX MICROSCOPIC
Bacteria, UA: NONE SEEN
Bilirubin Urine: NEGATIVE
GLUCOSE, UA: NEGATIVE mg/dL
HGB URINE DIPSTICK: NEGATIVE
KETONES UR: 80 mg/dL — AB
Leukocytes, UA: NEGATIVE
NITRITE: NEGATIVE
PROTEIN: 30 mg/dL — AB
Specific Gravity, Urine: 1.024 (ref 1.005–1.030)
pH: 6 (ref 5.0–8.0)

## 2018-02-22 LAB — CBC WITH DIFFERENTIAL/PLATELET
Abs Immature Granulocytes: 0 10*3/uL (ref 0.0–0.1)
BASOS ABS: 0 10*3/uL (ref 0.0–0.1)
Basophils Relative: 1 %
Eosinophils Absolute: 0 10*3/uL (ref 0.0–0.7)
Eosinophils Relative: 0 %
HEMATOCRIT: 48.9 % (ref 39.0–52.0)
HEMOGLOBIN: 15.3 g/dL (ref 13.0–17.0)
Immature Granulocytes: 0 %
LYMPHS ABS: 2.6 10*3/uL (ref 0.7–4.0)
LYMPHS PCT: 53 %
MCH: 25.5 pg — AB (ref 26.0–34.0)
MCHC: 31.3 g/dL (ref 30.0–36.0)
MCV: 81.4 fL (ref 78.0–100.0)
MONO ABS: 0.5 10*3/uL (ref 0.1–1.0)
Monocytes Relative: 11 %
Neutro Abs: 1.7 10*3/uL (ref 1.7–7.7)
Neutrophils Relative %: 35 %
Platelets: 208 10*3/uL (ref 150–400)
RBC: 6.01 MIL/uL — ABNORMAL HIGH (ref 4.22–5.81)
RDW: 12.7 % (ref 11.5–15.5)
WBC: 4.9 10*3/uL (ref 4.0–10.5)

## 2018-02-22 LAB — COMPREHENSIVE METABOLIC PANEL
ALBUMIN: 4.3 g/dL (ref 3.5–5.0)
ALT: 17 U/L (ref 0–44)
ANION GAP: 14 (ref 5–15)
AST: 21 U/L (ref 15–41)
Alkaline Phosphatase: 48 U/L (ref 38–126)
BILIRUBIN TOTAL: 1.6 mg/dL — AB (ref 0.3–1.2)
BUN: 9 mg/dL (ref 6–20)
CHLORIDE: 100 mmol/L (ref 98–111)
CO2: 26 mmol/L (ref 22–32)
Calcium: 9.9 mg/dL (ref 8.9–10.3)
Creatinine, Ser: 1.04 mg/dL (ref 0.61–1.24)
GFR calc Af Amer: >60 mL/min (ref >60)
GFR calc non Af Amer: >60 mL/min (ref >60)
GLUCOSE: 82 mg/dL (ref 70–99)
POTASSIUM: 3.5 mmol/L (ref 3.5–5.1)
Sodium: 140 mmol/L (ref 135–145)
TOTAL PROTEIN: 7.5 g/dL (ref 6.5–8.1)

## 2018-02-22 LAB — I-STAT CG4 LACTIC ACID, ED: LACTIC ACID, VENOUS: 1.41 mmol/L (ref 0.5–1.9)

## 2018-02-22 LAB — LIPASE, BLOOD: Lipase: 27 U/L (ref 11–51)

## 2018-02-22 MED ORDER — METOCLOPRAMIDE HCL 10 MG PO TABS: 10.0000 mg | ORAL_TABLET | Freq: Four times a day (QID) | ORAL | 0 refills | Status: DC

## 2018-02-22 NOTE — Telephone Encounter (Signed)
Mother is calling and would like to have a referral to a specialist of some type that would help find out why her son keeps vomiting and loosing weight. Maybe a GI. Please call mom since she is concerned and thinks that no one is helping her son.[phone]

## 2018-02-22 NOTE — Telephone Encounter (Signed)
Pt mother called and said her son has been in and out of the hospital for the past 12 days now. Pt has been seen at [facility], [facility] and now is being taken to [facility]. He has been consistently throwing up and now has started throwing up blood. Pt has lost 40 lbs lost in the past 12 days. Mother feels like he is not getting good care anywhere he goes and they are just hydrating him. Pt needs an urgent referral to a gastroenterologist. Please call mom back to discuss this. Tammi Klippel (mom) [phone]

## 2018-02-22 NOTE — ED Provider Notes (Signed)
[facility] EMERGENCY DEPARTMENT Provider Note   CSN: [id number] Arrival date & time: 02/22/18  1237     History   Chief Complaint Chief Complaint  Patient presents with  . Emesis    HPI Matthew Petersen is a 17 y.o. male.  17 year old male with history of cannabis abuse presents with several week history of persistent vomiting.  Denies any cannabis use in the past 2 weeks.  States that when he eats he has nonbilious emesis.  No fever or chills.  No diarrhea noted.  Diffuse abdominal cramping only occurs after he throws up.  Has been seen in the ED multiple times and prescribed various medications which have only had limited relief.  He has an appointment with the GI doctor next month.  Denies any weakness, syncope, or near syncope.     History reviewed. No pertinent past medical history.  Patient Active Problem List   Diagnosis Date Noted  . Intractable vomiting 02/22/2018  . Mood disorder of depressed type 12/29/2017  . Loss of weight 12/29/2017  . Exposure to STD 12/22/2017    History reviewed. No pertinent surgical history.      Home Medications    Prior to Admission medications   Medication Sig Start Date End Date Taking? Authorizing Provider  capsicum (ZOSTRIX) 0.075 % topical cream Apply 1 application topically 2 (two) times daily as needed (pain).    [provider]  diphenhydramine-acetaminophen (TYLENOL PM) 25-500 MG TABS tablet Take 2 tablets by mouth at bedtime as needed (sleep/pain).    [provider]  lansoprazole (PREVACID) 30 MG capsule Take 1 capsule (30 mg total) by mouth daily at 12 noon. 02/16/18   Loren Racer, MD  ondansetron (ZOFRAN ODT) 4 MG disintegrating tablet 4mg  ODT q4 hours prn nausea/vomit 02/16/18   Loren Racer, MD  ondansetron (ZOFRAN) 4 MG tablet Take 1 tablet (4 mg total) by mouth every 8 (eight) hours as needed for up to 10 days for nausea or vomiting. 02/12/18 02/22/18  Mortis, Jerrel Ivory I,  PA-C  potassium chloride SA (K-DUR,KLOR-CON) 20 MEQ tablet Take 1 tablet (20 mEq total) by mouth daily. 02/17/18   Loren Racer, MD    Family History History reviewed. No pertinent family history.  Social History Social History   Tobacco Use  . Smoking status: Smoker, Current Status Unknown  . Smokeless tobacco: Never Used  Substance Use Topics  . Alcohol use: Yes    Comment: 12/27/2017 last usage  . Drug use: Yes    Types: Marijuana    Comment: last used 02/05/2018     Allergies   Patient has no known allergies.   Review of Systems Review of Systems  All other systems reviewed and are negative.    Physical Exam Updated Vital Signs BP (!) 132/94   Pulse 84   Temp 98 F (36.7 C) (Oral)   Resp 17   SpO2 98%   Physical Exam  Constitutional: He is oriented to person, place, and time. He appears well-developed and well-nourished.  Non-toxic appearance. No distress.  HENT:  Head: Normocephalic and atraumatic.  Eyes: Pupils are equal, round, and reactive to light. Conjunctivae, EOM and lids are normal.  Neck: Normal range of motion. Neck supple. No tracheal deviation present. No thyroid mass present.  Cardiovascular: Normal rate, regular rhythm and normal heart sounds. Exam reveals no gallop.  No murmur heard. Pulmonary/Chest: Effort normal and breath sounds normal. No stridor. No respiratory distress. He has no decreased breath sounds. He  has no wheezes. He has no rhonchi. He has no rales.  Abdominal: Soft. Normal appearance and bowel sounds are normal. He exhibits no distension. There is no tenderness. There is no rebound and no CVA tenderness.  Musculoskeletal: Normal range of motion. He exhibits no edema or tenderness.  Neurological: He is alert and oriented to person, place, and time. He has normal strength. No cranial nerve deficit or sensory deficit. GCS eye subscore is 4. GCS verbal subscore is 5. GCS motor subscore is 6.  Skin: Skin is warm and dry. No abrasion  and no rash noted.  Psychiatric: He has a normal mood and affect. His speech is normal and behavior is normal.  Nursing note and vitals reviewed.    ED Treatments / Results  Labs (all labs ordered are listed, but only abnormal results are displayed) Labs Reviewed  COMPREHENSIVE METABOLIC PANEL - Abnormal; Notable for the following components:      Result Value   Total Bilirubin 1.6 (*)    All other components within normal limits  CBC WITH DIFFERENTIAL/PLATELET - Abnormal; Notable for the following components:   RBC 6.01 (*)    MCH 25.5 (*)    All other components within normal limits  URINALYSIS, ROUTINE W REFLEX MICROSCOPIC - Abnormal; Notable for the following components:   Color, Urine AMBER (*)    Ketones, ur 80 (*)    Protein, ur 30 (*)    All other components within normal limits  LIPASE, BLOOD  I-STAT CG4 LACTIC ACID, ED  I-STAT CG4 LACTIC ACID, ED    EKG None  Radiology Dg Chest 2 View  Result Date: 02/22/2018 CLINICAL DATA:  Vomiting. EXAM: CHEST - 2 VIEW COMPARISON:  None. FINDINGS: The heart size and mediastinal contours are within normal limits. Both lungs are hyperinflated, but clear. The visualized skeletal structures are unremarkable. IMPRESSION: No active cardiopulmonary disease. Electronically Signed   By: Kennith Center M.D.   On: 02/22/2018 13:31    Procedures Procedures (including critical care time)  Medications Ordered in ED Medications - No data to display   Initial Impression / Assessment and Plan / ED Course  I have reviewed the triage vital signs and the nursing notes.  Pertinent labs & imaging results that were available during my care of the patient were reviewed by me and considered in my medical decision making (see chart for details).     Patient's renal function is normal.  His urinalysis shows greater than 80 ketones indicating dehydration.  I have offered him IV fluids which she has refused at this time.  I will prescribe Reglan for  him and he was instructed to return if he changes mind.  This conversation was performed in front of his grandmother  Final Clinical Impressions(s) / ED Diagnoses   Final diagnoses:  None    ED Discharge Orders    None       Lorre Nick, MD 02/22/18 [phone]

## 2018-02-22 NOTE — Telephone Encounter (Signed)
Pt mother is calling again and she said she would like to talk to someone on Dr. Russ Halo team as soon as possible. The best contact number is [phone].

## 2018-02-22 NOTE — ED Triage Notes (Signed)
Pt in c/o continued vomiting, first seen on 8/30 for same, has been to ED multiple times with no improvement at home, vomiting any time he eats anything, sometimes is able to keep clear liquids down, denies abdominal pain at this time

## 2018-02-22 NOTE — Telephone Encounter (Signed)
Spoke with patient and Mom who are waiting in the ER currently  I will put in referal to GI  Urged them to make an appointment with me and IC  They agree

## 2018-02-22 NOTE — Assessment & Plan Note (Signed)
Continues to have persistent nausea and vomiting

## 2018-03-08 ENCOUNTER — Ambulatory Visit: Payer: Medicaid Other | Admitting: Family Medicine

## 2018-03-14 ENCOUNTER — Ambulatory Visit (HOSPITAL_COMMUNITY)
Admission: EM | Admit: 2018-03-14 | Discharge: 2018-03-14 | Disposition: A | Discharge status: Home / Self Care | Payer: Medicaid Other | Attending: Family Medicine | Admitting: Family Medicine

## 2018-03-14 ENCOUNTER — Other Ambulatory Visit: Payer: Self-pay

## 2018-03-14 ENCOUNTER — Encounter (HOSPITAL_COMMUNITY): Payer: Self-pay | Admitting: Emergency Medicine

## 2018-03-14 DIAGNOSIS — R369 Urethral discharge, unspecified: Secondary | ICD-10-CM | POA: Insufficient documentation

## 2018-03-14 DIAGNOSIS — J029 Acute pharyngitis, unspecified: Secondary | ICD-10-CM | POA: Diagnosis present

## 2018-03-14 DIAGNOSIS — R300 Dysuria: Secondary | ICD-10-CM | POA: Diagnosis not present

## 2018-03-14 DIAGNOSIS — R109 Unspecified abdominal pain: Secondary | ICD-10-CM | POA: Insufficient documentation

## 2018-03-14 DIAGNOSIS — F17200 Nicotine dependence, unspecified, uncomplicated: Secondary | ICD-10-CM | POA: Insufficient documentation

## 2018-03-14 LAB — POCT URINALYSIS DIP (DEVICE)
BILIRUBIN URINE: NEGATIVE
GLUCOSE, UA: NEGATIVE mg/dL
Hgb urine dipstick: NEGATIVE
KETONES UR: NEGATIVE mg/dL
Nitrite: NEGATIVE
Protein, ur: NEGATIVE mg/dL
Specific Gravity, Urine: 1.025 (ref 1.005–1.030)
Urobilinogen, UA: 1 mg/dL (ref 0.0–1.0)
pH: 5.5 (ref 5.0–8.0)

## 2018-03-14 LAB — POCT RAPID STREP A: STREPTOCOCCUS, GROUP A SCREEN (DIRECT): NEGATIVE

## 2018-03-14 MED ORDER — STERILE WATER FOR INJECTION IJ SOLN
INTRAMUSCULAR | Status: AC
  Filled 2018-03-14: qty 10

## 2018-03-14 MED ORDER — CETIRIZINE HCL 10 MG PO CAPS: 10.0000 mg | ORAL_CAPSULE | Freq: Every day | ORAL | 0 refills | Status: DC

## 2018-03-14 MED ORDER — AZITHROMYCIN 250 MG PO TABS
ORAL_TABLET | ORAL | Status: AC
  Filled 2018-03-14: qty 4

## 2018-03-14 MED ORDER — CEFTRIAXONE SODIUM 250 MG IJ SOLR
INTRAMUSCULAR | Status: AC
  Filled 2018-03-14: qty 250

## 2018-03-14 MED ORDER — CEFTRIAXONE SODIUM 250 MG IJ SOLR
250.0000 mg | Freq: Once | INTRAMUSCULAR | Status: AC
  Administered 2018-03-14: 250 mg via INTRAMUSCULAR

## 2018-03-14 MED ORDER — AZITHROMYCIN 250 MG PO TABS
1000.0000 mg | ORAL_TABLET | Freq: Once | ORAL | Status: AC
  Administered 2018-03-14: 1000 mg via ORAL

## 2018-03-14 NOTE — ED Notes (Signed)
Dirty and clean urine collected. 

## 2018-03-14 NOTE — ED Triage Notes (Signed)
The patient presented to the [facility] with a complaint of a sore throat, abdominal pain and penile discharge with dysuria.

## 2018-03-14 NOTE — Discharge Instructions (Addendum)
Sore throat most likely from drainage, please begin daily allergy pill to help with this, we will also send off the swab to check for STDs as cause as well.  We have treated you today for gonorrhea and chlamydia, with Rocephin and azithromycin. Please refrain from sexual activity for 7 days while medicine is clearing infection.  We are testing you for Gonorrhea, Chlamydia and Trichomonas. We will call you if anything is positive and let you know if you require any further treatment. Please inform partner of any positive results.  Please return if symptoms not improving with treatment, development of fever, nausea, vomiting, abdominal pain, scrotal pain.

## 2018-03-15 LAB — URINE CYTOLOGY ANCILLARY ONLY
Chlamydia: NEGATIVE
Neisseria Gonorrhea: POSITIVE — AB
TRICH (WINDOWPATH): NEGATIVE

## 2018-03-15 LAB — CYTOLOGY, (ORAL, ANAL, URETHRAL) ANCILLARY ONLY
CHLAMYDIA, DNA PROBE: NEGATIVE
Neisseria Gonorrhea: NEGATIVE
Trichomonas: NEGATIVE

## 2018-03-15 NOTE — ED Provider Notes (Signed)
[facility]    CSN: [id number] Arrival date & time: 03/14/18  1232     History   Chief Complaint Chief Complaint  Patient presents with  . Sore Throat  . Abdominal Pain    HPI Matthew Petersen is a 17 y.o. male presenting today for evaluation of sore throat, abdominal pain as well as penile discharge.  Patient states that for the past 2 to 3 days he has had a sore throat.  Since he is also noticed discharge and dysuria.  Has felt slightly feverish when his sore throat began, but no known fevers.  He denies any nausea, vomiting or diarrhea.  Eating and drinking like normal.  Has had some slight congestion, denies cough.  Has not taken any medicines for this.  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  Patient participates in both male male and male male intercourse.  Recently tested for HIV and was negative.  HPI  History reviewed. No pertinent past medical history.  Patient Active Problem List   Diagnosis Date Noted  . Intractable vomiting 02/22/2018  . Mood disorder of depressed type 12/29/2017  . Loss of weight 12/29/2017  . Exposure to STD 12/22/2017    History reviewed. No pertinent surgical history.     Home Medications    Prior to Admission medications   Medication Sig Start Date End Date Taking? Authorizing Provider  Cetirizine HCl 10 MG CAPS Take 1 capsule (10 mg total) by mouth daily for 10 days. 03/14/18 03/24/18  Wieters, Junius Creamer, PA-C    Family History History reviewed. No pertinent family history.  Social History Social History   Tobacco Use  . Smoking status: Smoker, Current Status Unknown  . Smokeless tobacco: Never Used  Substance Use Topics  . Alcohol use: Yes    Comment: 12/27/2017 last usage  . Drug use: Yes    Types: Marijuana    Comment: last used 02/05/2018     Allergies   Patient has no known allergies.   Review of Systems Review of Systems  Constitutional: Negative for fever.    HENT: Positive for congestion and sore throat.   Respiratory: Negative for shortness of breath.   Cardiovascular: Negative for chest pain.  Gastrointestinal: Positive for abdominal pain. Negative for nausea and vomiting.  Genitourinary: Positive for discharge. Negative for difficulty urinating, dysuria, frequency, penile pain, penile swelling, scrotal swelling and testicular pain.  Skin: Negative for rash.  Neurological: Negative for dizziness, light-headedness and headaches.     Physical Exam Triage Vital Signs ED Triage Vitals  Enc Vitals Group     BP 03/14/18 1328 118/64     Pulse Rate 03/14/18 1328 100     Resp 03/14/18 1328 16     Temp 03/14/18 1328 99.2 F (37.3 C)     Temp Source 03/14/18 1328 Oral     SpO2 03/14/18 1328 100 %     Weight --      Height --      Head Circumference --      Peak Flow --      Pain Score 03/14/18 1327 6     Pain Loc --      Pain Edu? --      Excl. in GC? --    No data found.  Updated Vital Signs BP 118/64 (BP Location: Left Arm)   Pulse 100   Temp 99.2 F (37.3 C) (Oral)   Resp 16   SpO2 100%  Visual Acuity Right Eye Distance:   Left Eye Distance:   Bilateral Distance:    Right Eye Near:   Left Eye Near:    Bilateral Near:     Physical Exam  Constitutional: He appears well-developed and well-nourished.  HENT:  Head: Normocephalic and atraumatic.  Bilateral ears without tenderness to palpation of external auricle, tragus and mastoid, EAC's without erythema or swelling, TM's with good bony landmarks and cone of light. Non erythematous.  Oral mucosa pink and moist, no tonsillar enlargement, posterior pharynx erythematous with cobblestoning, mucus present on posterior pharynx and actively draining. no uvula deviation or swelling. Normal phonation.  Eyes: Conjunctivae are normal.  Neck: Neck supple.  Cardiovascular: Normal rate and regular rhythm.  No murmur heard. Pulmonary/Chest: Effort normal and breath sounds normal. No  respiratory distress.  Breathing comfortably at rest, CTABL, no wheezing, rales or other adventitious sounds auscultated  Abdominal: Soft. There is no tenderness.  Nontender to light deep palpation throughout all 4 quadrants  Musculoskeletal: He exhibits no edema.  Neurological: He is alert.  Skin: Skin is warm and dry.  Psychiatric: He has a normal mood and affect.  Nursing note and vitals reviewed.    UC Treatments / Results  Labs (all labs ordered are listed, but only abnormal results are displayed) Labs Reviewed  POCT URINALYSIS DIP (DEVICE) - Abnormal; Notable for the following components:      Result Value   Leukocytes, UA TRACE (*)    All other components within normal limits  CULTURE, GROUP A STREP [facility])  POCT RAPID STREP A  URINE CYTOLOGY ANCILLARY ONLY  CYTOLOGY, (ORAL, ANAL, URETHRAL) ANCILLARY ONLY    EKG None  Radiology No results found.  Procedures Procedures (including critical care time)  Medications Ordered in UC Medications  cefTRIAXone (ROCEPHIN) injection 250 mg (250 mg Intramuscular Given 03/14/18 1421)  azithromycin (ZITHROMAX) tablet 1,000 mg (1,000 mg Oral Given 03/14/18 1421)    Initial Impression / Assessment and Plan / UC Course  I have reviewed the triage vital signs and the nursing notes.  Pertinent labs & imaging results that were available during my care of the patient were reviewed by me and considered in my medical decision making (see chart for details).     Patient with sore throat, strep test negative, likely drainage, also swab for STDs.  We will go ahead and empirically treat for gonorrhea and chlamydia given patient with penile discharge, Rocephin and azithromycin provided.  Urine cytology obtained to check for STDs and urine as well.  Will treat sore throat with daily allergy pill, anti-inflammatories.Discussed strict return precautions. Patient verbalized understanding and is agreeable with plan.  Final Clinical Impressions(s)  / UC Diagnoses   Final diagnoses:  Sore throat  Penile discharge     Discharge Instructions     Sore throat most likely from drainage, please begin daily allergy pill to help with this, we will also send off the swab to check for STDs as cause as well.  We have treated you today for gonorrhea and chlamydia, with Rocephin and azithromycin. Please refrain from sexual activity for 7 days while medicine is clearing infection.  We are testing you for Gonorrhea, Chlamydia and Trichomonas. We will call you if anything is positive and let you know if you require any further treatment. Please inform partner of any positive results.  Please return if symptoms not improving with treatment, development of fever, nausea, vomiting, abdominal pain, scrotal pain.   ED Prescriptions    Medication  Sig Dispense Auth. Provider   Cetirizine HCl 10 MG CAPS Take 1 capsule (10 mg total) by mouth daily for 10 days. 10 capsule Wieters, Hallie C, PA-C     Controlled Substance Prescriptions [place] Controlled Substance Registry consulted? Not Applicable   Lew Dawes, [place] 03/15/18 1607

## 2018-03-16 LAB — CULTURE, GROUP A STREP (THRC)

## 2018-03-17 ENCOUNTER — Telehealth (HOSPITAL_COMMUNITY): Payer: Self-pay

## 2018-03-17 NOTE — Telephone Encounter (Signed)
Test for gonorrhea was positive. This was treated at the urgent care visit with IM rocephin 250mg and po zithromax 1g. Pt called regarding test results, instructed patient to refrain from sexual intercourse for 7 days after treatment to give the medicine time to work. Sexual partners need to be notified and tested/treated. Condoms may reduce risk of reinfection. Recheck or followup with PCP for further evaluation if symptoms are not improving. Answered all patient questions. [facility] notified.  

## 2018-04-01 ENCOUNTER — Other Ambulatory Visit: Payer: Medicaid Other

## 2018-04-01 ENCOUNTER — Ambulatory Visit (INDEPENDENT_AMBULATORY_CARE_PROVIDER_SITE_OTHER): Payer: Medicaid Other | Admitting: Gastroenterology

## 2018-04-01 ENCOUNTER — Encounter: Payer: Self-pay | Admitting: Gastroenterology

## 2018-04-01 VITALS — BP 100/60 | HR 105 | Ht 68.5 in | Wt 141.0 lb

## 2018-04-01 DIAGNOSIS — R112 Nausea with vomiting, unspecified: Secondary | ICD-10-CM

## 2018-04-01 NOTE — Patient Instructions (Addendum)
Your provider has requested that you go to the basement level for lab work before leaving today. Press "B" on the elevator. The lab is located at the first door on the left as you exit the elevator.  You have been scheduled for an Upper GI Series and Small Bowel Follow Thru at [facility]. Your appointment is on 04/06/18  at 9:30 am. Please arrive 15 minutes prior to your test for registration. Make certain not to have anything to eat or drink after midnight on the night before your test. If you need to reschedule, please contact radiology at [phone]. --------------------------------------------------------------------------------------------------------------- An upper GI series uses x rays to help diagnose problems of the upper GI tract, which includes the esophagus, stomach, and duodenum. The duodenum is the first part of the small intestine. An upper GI series is conducted by a radiology technologist or a radiologist-a doctor who specializes in x-ray imaging-at a hospital or outpatient center. While sitting or standing in front of an x-ray machine, the patient drinks barium liquid, which is often white and has a chalky consistency and taste. The barium liquid coats the lining of the upper GI tract and makes signs of disease show up more clearly on x rays. X-ray video, called fluoroscopy, is used to view the barium liquid moving through the esophagus, stomach, and duodenum. Additional x rays and fluoroscopy are performed while the patient lies on an x-ray table. To fully coat the upper GI tract with barium liquid, the technologist or radiologist may press on the abdomen or ask the patient to change position. Patients hold still in various positions, allowing the technologist or radiologist to take x rays of the upper GI tract at different angles. If a technologist conducts the upper GI series, a radiologist will later examine the images to look for problems.  This test typically takes about  1 hour to complete ---------------------------------------------------------------------------------------------------------------------------------------------  This test typically takes around 1 hour to complete.  Important Drink plenty of water (8-10 cups/day) for a few days following the procedure to avoid constipation and blockage. The barium will make your stools white for a few days. --------------------------------------------------------------------------------------------------------------------------------------------  Cannabinoid Hyperemesis Syndrome Cannabinoid hyperemesis syndrome (CHS) is a condition that causes repeated nausea, vomiting, and abdominal pain after long-term (chronic) use of marijuana (cannabis). People with CHS typically use marijuana 3-5 times a day for many years before they have symptoms, although it is possible to develop CHS with as little as 1 use per day. Symptoms of CHS may be mild at first but can get worse and more frequent. In some cases, CHS may cause vomiting many times a day, which can lead to weight loss and dehydration. CHS may go away and come back many times (recur). People may not have symptoms or may otherwise be healthy in between [facility] attacks. What are the causes? The exact cause of this condition is not known. Long-term use of marijuana may over-stimulate certain proteins in the brain that react with chemicals in marijuana (cannabinoid receptors). This over-stimulation may cause CHS. What are the signs or symptoms? Symptoms of this condition are often mild during the first few attacks, but they can get worse over time. Symptoms may include:  Frequent nausea, especially early in the morning.  Vomiting.  Abdominal pain.  Taking several hot showers throughout the day can also be a sign of this condition. People with CHS may do this because it relieves symptoms. How is this diagnosed? This condition may be diagnosed based on:  Your symptoms  and medical history, including any drug use.  A physical exam.  You may have tests done to rule out other problems. These tests may include:  Blood tests.  Urine tests.  Imaging tests, such as an X-ray or CT scan.  How is this treated? Treatment for this condition involves stopping marijuana use. Your health care provider may recommend:  A drug rehabilitation program, if you have trouble stopping marijuana use.  Medicines for nausea.  Hot showers to help relieve symptoms.  Certain creams that contain a substance called capsaicin may improve symptoms when applied to the abdomen. Ask your health care provider before starting any medicines or other treatments. Severe nausea and vomiting may require you to stay at the hospital. You may need IV fluids to prevent or treat dehydration. You may also need certain medicines that must be given at the hospital. Follow these instructions at home: During an attack  Stay in bed and rest in a dark, quiet room.  Take anti-nausea medicine as told by your health care provider.  Try taking hot showers to relieve your symptoms. After an attack  Drink small amounts of clear fluids slowly. Gradually add more.  Once you are able to eat without vomiting, eat soft foods in small amounts every 3-4 hours. General instructions  Do not use any products that contain marijuana.If you need help quitting, ask your health care provider for resources and treatment options.  Drink enough fluid to keep your urine pale yellow. Avoid drinking fluids that have a lot of sugar or caffeine, such as coffee and soda.  Take and apply over-the-counter and prescription medicines only as told by your health care provider. Ask your health care provider before starting any new medicines or treatments.  Keep all follow-up visits as told by your health care provider. This is important. Contact a health care provider if:  Your symptoms get worse.  You cannot drink fluids  without vomiting.  You have pain and trouble swallowing after an attack. Get help right away if:  You cannot stop vomiting.  You have blood in your vomit or your vomit looks like coffee grounds.  You have severe abdominal pain.  You have stools that are bloody or black, or stools that look like tar.  You have symptoms of dehydration, such as: ? Sunken eyes. ? Inability to make tears. ? Cracked lips. ? Dry mouth. ? Decreased urine production. ? Weakness. ? Sleepiness. ? Fainting. Summary  Cannabinoid hyperemesis syndrome (CHS) is a condition that causes repeated nausea, vomiting, and abdominal pain after long-term use of marijuana.  People with CHS typically use marijuana 3-5 times a day for many years before they have symptoms, although it is possible to develop CHS with as little as 1 use per day.  Treatment for this condition involves stopping marijuana use. Hot showers and capsaicin creams may also help relieve symptoms. Ask your health care provider before starting any medicines or other treatments.  Your health care provider may prescribe medicines to help with nausea.  Get help right away if you have signs of dehydration, such as dry mouth, decreased urine production, or weakness. This information is not intended to replace advice given to you by your health care provider. Make sure you discuss any questions you have with your health care provider. Document Released: 09/09/2016 Document Revised: 09/09/2016 Document Reviewed: 09/09/2016 Elsevier Interactive Patient Education  [employer].

## 2018-04-01 NOTE — Progress Notes (Signed)
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 However, will exclude concurrent process such at H pylori, reflux, or less likely PUD.  I have asked him to discuss treatment options for anxiety and mood stabilization with Dr. Deirdre Priest with the goal of abstaining from marijuana use.   PLAN: H pylori antigen UGI series Abstinence from marijuana Information on Cannabis Hyperemesis Syndrome provided to the patient Return as needed    HPI: Matthew Petersen is a 17 y.o. male referred for nausea, vomiting, and abdominal pain.  The history is obtained through the patient and review of the electronic health record. He has had 5 recent emergency room visits for nausea vomiting abdominal pain. Symptoms start with diffuse, cramping abdominal pain followed by the developed on nausea and vomiting. Emesis will be yellow, green, or even undigested food. He is able to control the symptoms with hot baths and showers. He has been unable to identify any other exacerbating or relieving features. No dysphagia, odynophagia, heartburn, brash, hematemesis, or change in bowel habits. Weight is stable.  GI ROS is otherwise negative. No other associated symptoms.  Symptoms get so bad he becomes dehydrated. In the ED he is given IV fluids and antiemetics and is able to go home. He has required potassium replacement for associated hypokalemia.   He is concerned because no one has been able to explain what's going on despite an abdominal ultrasound and chest x-ray. ER records show that the physicians suspected cannabinoid hyperemesis syndrome but he does not believe me.  He was found to be hypokalemic and was given 2 rounds of potassium.   Normal ultrasound 02/16/18. No gallstones.   Normal chest x-ray 02/22/18.  He smokes marijuana most days in an effort to calm his nerves and make him "not care as much." He thinks these symptoms occur both during times of use and during times of abstinence. He denies the use of alcohol or other street drugs. 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   History reviewed. No pertinent past medical history.  History reviewed. No pertinent surgical history.  Prior to Admission medications   Medication Sig Start Date End Date Taking? Authorizing Provider  Cetirizine HCl 10 MG CAPS Take 1 capsule (10 mg total) by mouth daily for 10 days. 03/14/18 03/24/18  Wieters, Junius Creamer, PA-C    Current Outpatient Medications  Medication Sig Dispense Refill  . Cetirizine HCl 10 MG CAPS Take 1 capsule by mouth daily.     No current facility-administered medications for this visit.     Allergies as of 04/01/2018  . (No Known Allergies)    Family History  Problem Relation Age of Onset  . Diabetes Mother   . Multiple sclerosis Father     Social History   Socioeconomic History  . Marital status: Single    Spouse name: Not on file  . Number of children: Not on file  . Years of education: Not on file  . Highest education level: Not on file  Occupational History  . Not on file  Social Needs  . Financial resource strain: Not on file  . Food insecurity:    Worry: Not on file    Inability: Not on file  . Transportation needs:  Medical: Not on file    Non-medical: Not on file  Tobacco Use  . Smoking status: Never Smoker  . Smokeless tobacco: Never Used  Substance and Sexual Activity  . Alcohol use: Yes    Comment: 12/27/2017 last usage  . Drug use: Yes    Types: Marijuana  . Sexual activity: Not on file  Lifestyle  . Physical activity:    Days per week: Not on file    Minutes per session: Not on file  . Stress: Not on file  Relationships  . Social connections:    Talks on phone: Not on  file    Gets together: Not on file    Attends religious service: Not on file    Active member of club or organization: Not on file    Attends meetings of clubs or organizations: Not on file    Relationship status: Not on file  . Intimate partner violence:    Fear of current or ex partner: Not on file    Emotionally abused: Not on file    Physically abused: Not on file    Forced sexual activity: Not on file  Other Topics Concern  . Not on file  Social History Narrative  . Not on file    Review of Systems: 12 system ROS is negative except as noted above.   Physical Exam:   General:   Alert, well-nourished, pleasant and cooperative in NAD Head:  Normocephalic and atraumatic. Eyes:  Sclera clear, no icterus.   Conjunctiva pink. Mouth:  No deformity or lesions.   Neck:  Supple; no thyromegaly. Lungs:  Clear throughout to auscultation.   No wheezes.  Heart:  Regular rate and rhythm; no murmurs Abdomen:  Soft, thin, nontender, normal bowel sounds. No rebound or guarding. No hepatosplenomegaly. No Murphy's sign. Rectal:  Deferred  Msk:  Symmetrical without gross deformities. Extremities:  No gross deformities or edema. Neurologic:  Alert and  oriented x4;  grossly nonfocal Skin:  No rash or bruise. Psych:  Alert and cooperative. Normal mood and affect    Kadden Osterhout L. Orvan Falconer Md, MPH [facility] 04/01/2018, 3:18 PM

## 2018-04-06 ENCOUNTER — Ambulatory Visit (HOSPITAL_COMMUNITY): Admission: RE | Admit: 2018-04-06 | Payer: Medicaid Other | Source: Ambulatory Visit

## 2018-07-19 ENCOUNTER — Encounter: Payer: Self-pay | Admitting: Family Medicine

## 2018-07-19 ENCOUNTER — Ambulatory Visit (INDEPENDENT_AMBULATORY_CARE_PROVIDER_SITE_OTHER): Payer: Self-pay | Admitting: Family Medicine

## 2018-07-19 DIAGNOSIS — R634 Abnormal weight loss: Secondary | ICD-10-CM

## 2018-07-19 DIAGNOSIS — N529 Male erectile dysfunction, unspecified: Secondary | ICD-10-CM | POA: Insufficient documentation

## 2018-07-19 LAB — POCT SEDIMENTATION RATE: POCT SED RATE: 0 mm/hr (ref 0–22)

## 2018-07-19 NOTE — Patient Instructions (Signed)
Good to see you today!  Thanks for coming in.  We will check the blood tests.  I will call you if your lab tests are not normal.  Otherwise we will discuss them at your next visit.  Let me know if you have loss of control of bowel or bladder or major weakness in your legs  Note the situation that you might have problems - any particular stress or anxiety   Come back in 2-3 weeks

## 2018-07-19 NOTE — Assessment & Plan Note (Signed)
No signs of nerve or vascular compromise.  Likely situational although given weight loss needs a more thorough investigation.  Will start with labs

## 2018-07-19 NOTE — Assessment & Plan Note (Signed)
Worsening.  No evident cause.  Reports eating better and no nausea and vomiting.  Will check labs and monitor

## 2018-07-19 NOTE — Progress Notes (Signed)
Subjective  Matthew Petersen is a 18 y.o. male is presenting with the following   WEIGHT LOSS He feels he is eating better and having much less nausea and vomiting since having cut back on cannabis.  No fever or nausea and vomiting or skin changes or night sweats or diarrhea.  He is unsure why he is still losing weight. He feels his anxiety is under pretty good control.   ERECTILE DYSFUNCTION Has noticed intermittent loss of erections during sex.  No specific anxiety or situation.  No lower extremity weakness or incontinence or back pain or fever.  Use marijuana regularly but less so than in past.  No other drugs or medications   Chief Complaint noted Review of Symptoms - see HPI PMH - Smoking status noted.  Redc Objective Vital Signs reviewed BP 90/62   Pulse 65   Temp 98.5 F (36.9 C) (Oral)   Ht 5' 8.5" (1.74 m)   Wt 139 lb 12.8 oz (63.4 kg)   SpO2 99%   BMI 20.95 kg/m  Neurologic exam : Cn 2-7 intact Strength equal & normal in upper & lower extremities Able to walk on heels and toes.   Balance normal  Heart - Regular rate and rhythm.  No murmurs, gallops or rubs.    Lungs:  Normal respiratory effort, chest expands symmetrically. Lungs are clear to auscultation, no crackles or wheezes. Abdomen: soft and non-tender without masses, organomegaly or hernias noted.  No guarding or rebound GU , normal testis, no lesions or discharge, normal hair growth  Neck:  No deformities, thyromegaly, masses, or tenderness noted.   Supple with full range of motion without pain. No groin adenopathy    Assessments/Plans  See after visit summary for details of patient instuctions  Loss of weight Worsening.  No evident cause.  Reports eating better and no nausea and vomiting.  Will check labs and monitor   Erectile dysfunction No signs of nerve or vascular compromise.  Likely situational although given weight loss needs a more thorough investigation.  Will start with labs

## 2018-07-21 ENCOUNTER — Telehealth: Payer: Self-pay | Admitting: Family Medicine

## 2018-07-21 ENCOUNTER — Telehealth: Payer: Self-pay | Admitting: Infectious Disease

## 2018-07-21 ENCOUNTER — Encounter: Payer: Self-pay | Admitting: Family Medicine

## 2018-07-21 DIAGNOSIS — Z21 Asymptomatic human immunodeficiency virus [HIV] infection status: Secondary | ICD-10-CM

## 2018-07-21 DIAGNOSIS — B20 Human immunodeficiency virus [HIV] disease: Secondary | ICD-10-CM | POA: Insufficient documentation

## 2018-07-21 LAB — CBC
Hematocrit: 43.4 % (ref 37.5–51.0)
Hemoglobin: 14.3 g/dL (ref 13.0–17.7)
MCH: 26.3 pg — ABNORMAL LOW (ref 26.6–33.0)
MCHC: 32.9 g/dL (ref 31.5–35.7)
MCV: 80 fL (ref 79–97)
Platelets: 161 10*3/uL (ref 150–450)
RBC: 5.44 x10E6/uL (ref 4.14–5.80)
RDW: 13 % (ref 11.6–15.4)
WBC: 3.9 10*3/uL (ref 3.4–10.8)

## 2018-07-21 LAB — CMP14+EGFR
ALBUMIN: 4.7 g/dL (ref 4.1–5.2)
ALK PHOS: 70 IU/L (ref 39–117)
ALT: 22 IU/L (ref 0–44)
AST: 23 IU/L (ref 0–40)
Albumin/Globulin Ratio: 2 (ref 1.2–2.2)
BILIRUBIN TOTAL: 1 mg/dL (ref 0.0–1.2)
BUN / CREAT RATIO: 9 (ref 9–20)
BUN: 8 mg/dL (ref 6–20)
CHLORIDE: 104 mmol/L (ref 96–106)
CO2: 24 mmol/L (ref 20–29)
Calcium: 9.5 mg/dL (ref 8.7–10.2)
Creatinine, Ser: 0.93 mg/dL (ref 0.76–1.27)
GFR calc Af Amer: 137 mL/min/{1.73_m2} (ref >59)
GFR calc non Af Amer: 119 mL/min/{1.73_m2} (ref >59)
GLOBULIN, TOTAL: 2.4 g/dL (ref 1.5–4.5)
GLUCOSE: 94 mg/dL (ref 65–99)
Potassium: 4.2 mmol/L (ref 3.5–5.2)
Sodium: 142 mmol/L (ref 134–144)
Total Protein: 7.1 g/dL (ref 6.0–8.5)

## 2018-07-21 LAB — HIV 1/2 AB DIFFERENTIATION
HIV 1 Ab: POSITIVE — AB
HIV 2 Ab: NEGATIVE
NOTE (HIV CONF MULTIP: POSITIVE

## 2018-07-21 LAB — HIV ANTIBODY (ROUTINE TESTING W REFLEX): HIV SCREEN 4TH GENERATION: REACTIVE — AB

## 2018-07-21 LAB — TSH: TSH: 1.57 u[IU]/mL (ref 0.450–4.500)

## 2018-07-21 NOTE — Telephone Encounter (Signed)
Attempted to call was busy Will retry   Still busy 11;19

## 2018-07-21 NOTE — Telephone Encounter (Signed)
Thanks for following up on him.  I have been trying to call but unable to connect.   I will try to call his mother to see if I can get contact information.    If I can't what is a Games developerDIS officer and how do we deploy them?  Thanks  Nedra HaiLee

## 2018-07-21 NOTE — Telephone Encounter (Signed)
Called phone rang 6-7 times then stopped no vm available  Tried 5 min later immediately rings busy Will try again tomorrow.  If he calls please ask him to leave a number and time when I can talk with him

## 2018-07-21 NOTE — Telephone Encounter (Signed)
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  Are you bringing back into disclosed to him his positive test?.  We would be happy to see him as soon as possible in clinic and get him started on antiretrovirals immediately.  If he is difficult to connect with we will need to deploy the DIS officers to track them down and connec him to our clinic  If transportation and issue we could use Lyft that we have grant for or ust to make sure though he would need to sign a waiver

## 2018-07-21 NOTE — Telephone Encounter (Signed)
Spoke with his Mom Samul Dada.  She does not have a cell number for him but communicates through social media.  She will contact him and ask him to contact us.   Gave her my cell number to give him

## 2018-07-21 NOTE — Telephone Encounter (Signed)
I am with Mr Matthew Petersen now.  I have put in a referral and spoke with your office.  He has an appointment

## 2018-07-21 NOTE — Telephone Encounter (Signed)
Met with Matthew Petersen in our office after hours and discussed diagnosis and next steps  Has appointment Monday 930 at [facility]

## 2018-07-21 NOTE — Progress Notes (Signed)
Met with patient after hours in [facility] Informed him of HIV diagnosis and next steps. He was quite tearful at first but was able to make an appointment with [facility] on Monday at 57 He plans to bring his partner with him He did not want me to contact any one else for him He plans to discuss the diagnosis with his partner He feels very sad but has no suicidal ideation  He is able to repeat that HIV is very treatable He has my cell number and I encouraged him to call if he has concerns or problems with follow up  He agrees

## 2018-07-21 NOTE — Telephone Encounter (Signed)
Gaynell FaceMarshall dont about that part we can handle it DIS hands for disease intervention specialist.  They are employed by the [place] and have to go see patients who are newly diagnosed with HIV anyway so they can find out who their contacts are and to inform them anonymously.  We use a lot of times to link to patients such as this 1 and communicate with them about getting into our clinic quickly  Alesia Morinravis Poole will get in touch with him

## 2018-07-21 NOTE — Telephone Encounter (Signed)
He just spoke with your clinic and has an appointment on Monday.  If you want to call me my celll is [phone]

## 2018-07-21 NOTE — Telephone Encounter (Signed)
Thanks Armando Gang may need to get DIS going here. Maybe they can take a lyft consent to him and we can bring him to clinic?

## 2018-07-25 ENCOUNTER — Ambulatory Visit: Payer: Self-pay

## 2018-07-25 ENCOUNTER — Other Ambulatory Visit (HOSPITAL_COMMUNITY)
Admission: RE | Admit: 2018-07-25 | Discharge: 2018-07-25 | Disposition: A | Discharge status: Home / Self Care | Payer: BLUE CROSS/BLUE SHIELD | Source: Ambulatory Visit | Attending: Infectious Disease | Admitting: Infectious Disease

## 2018-07-25 ENCOUNTER — Other Ambulatory Visit: Payer: BLUE CROSS/BLUE SHIELD

## 2018-07-25 DIAGNOSIS — B20 Human immunodeficiency virus [HIV] disease: Secondary | ICD-10-CM | POA: Insufficient documentation

## 2018-07-25 DIAGNOSIS — Z113 Encounter for screening for infections with a predominantly sexual mode of transmission: Secondary | ICD-10-CM | POA: Insufficient documentation

## 2018-07-25 DIAGNOSIS — Z79899 Other long term (current) drug therapy: Secondary | ICD-10-CM

## 2018-07-26 LAB — URINE CYTOLOGY ANCILLARY ONLY
Chlamydia: NEGATIVE
Neisseria Gonorrhea: NEGATIVE

## 2018-07-26 LAB — T-HELPER CELL (CD4) - (RCID CLINIC ONLY)
CD4 % Helper T Cell: 24 % — ABNORMAL LOW (ref 33–55)
CD4 T CELL ABS: 760 /uL (ref 400–2700)

## 2018-07-27 NOTE — Telephone Encounter (Signed)
Patient scheduled to see Tammy Sours 08/10/18.

## 2018-07-28 NOTE — Telephone Encounter (Signed)
Okay very good thanks [employer]

## 2018-08-02 ENCOUNTER — Ambulatory Visit: Payer: Self-pay | Admitting: Family Medicine

## 2018-08-03 LAB — QUANTIFERON-TB GOLD PLUS
Mitogen-NIL: 5.8 IU/mL
NIL: 0.08 IU/mL
QUANTIFERON-TB GOLD PLUS: NEGATIVE
TB1-NIL: <0 IU/mL

## 2018-08-03 LAB — CBC WITH DIFFERENTIAL/PLATELET
Absolute Monocytes: 343 cells/uL (ref 200–950)
BASOS ABS: 10 {cells}/uL (ref 0–200)
Basophils Relative: 0.2 %
Eosinophils Absolute: 20 cells/uL (ref 15–500)
Eosinophils Relative: 0.4 %
HCT: 42.8 % (ref 38.5–50.0)
Hemoglobin: 14.3 g/dL (ref 13.2–17.1)
Lymphs Abs: 3396 cells/uL (ref 850–3900)
MCH: 26.2 pg — ABNORMAL LOW (ref 27.0–33.0)
MCHC: 33.4 g/dL (ref 32.0–36.0)
MCV: 78.4 fL — ABNORMAL LOW (ref 80.0–100.0)
MPV: 11 fL (ref 7.5–12.5)
Monocytes Relative: 7 %
NEUTROS PCT: 23.1 %
Neutro Abs: 1132 cells/uL — ABNORMAL LOW (ref 1500–7800)
Platelets: 166 10*3/uL (ref 140–400)
RBC: 5.46 10*6/uL (ref 4.20–5.80)
RDW: 13.3 % (ref 11.0–15.0)
Total Lymphocyte: 69.3 %
WBC: 4.9 10*3/uL (ref 3.8–10.8)

## 2018-08-03 LAB — LIPID PANEL
Cholesterol: 182 mg/dL — ABNORMAL HIGH (ref <170)
HDL: 48 mg/dL (ref >45)
LDL Cholesterol (Calc): 118 mg/dL (calc) — ABNORMAL HIGH (ref <110)
Non-HDL Cholesterol (Calc): 134 mg/dL (calc) — ABNORMAL HIGH (ref <120)
Total CHOL/HDL Ratio: 3.8 (calc) (ref <5.0)
Triglycerides: 69 mg/dL (ref <90)

## 2018-08-03 LAB — HEPATITIS A ANTIBODY, TOTAL: HEPATITIS A AB,TOTAL: REACTIVE — AB

## 2018-08-03 LAB — HIV ANTIBODY (ROUTINE TESTING W REFLEX): HIV 1&2 Ab, 4th Generation: REACTIVE — AB

## 2018-08-03 LAB — COMPLETE METABOLIC PANEL WITH GFR
AG RATIO: 1.9 (calc) (ref 1.0–2.5)
ALBUMIN MSPROF: 5 g/dL (ref 3.6–5.1)
ALT: 15 U/L (ref 8–46)
AST: 20 U/L (ref 12–32)
Alkaline phosphatase (APISO): 64 U/L (ref 46–169)
BUN: 13 mg/dL (ref 7–20)
CO2: 27 mmol/L (ref 20–32)
Calcium: 9.6 mg/dL (ref 8.9–10.4)
Chloride: 104 mmol/L (ref 98–110)
Creat: 0.89 mg/dL (ref 0.60–1.26)
GFR, Est African American: 144 mL/min/{1.73_m2} (ref >=60)
GFR, Est Non African American: 124 mL/min/{1.73_m2} (ref >=60)
Globulin: 2.7 g/dL (calc) (ref 2.1–3.5)
Glucose, Bld: 80 mg/dL (ref 65–99)
Potassium: 3.7 mmol/L — ABNORMAL LOW (ref 3.8–5.1)
Sodium: 139 mmol/L (ref 135–146)
Total Bilirubin: 0.9 mg/dL (ref 0.2–1.1)
Total Protein: 7.7 g/dL (ref 6.3–8.2)

## 2018-08-03 LAB — HLA B*5701: HLA-B*5701 w/rflx HLA-B High: NEGATIVE

## 2018-08-03 LAB — HIV-1 RNA ULTRAQUANT REFLEX TO GENTYP+
HIV 1 RNA Quant: 139000 copies/mL — ABNORMAL HIGH
HIV-1 RNA Quant, Log: 5.14 Log copies/mL — ABNORMAL HIGH

## 2018-08-03 LAB — RPR: RPR Ser Ql: NONREACTIVE

## 2018-08-03 LAB — HEPATITIS B SURFACE ANTIBODY,QUALITATIVE: Hep B S Ab: NONREACTIVE

## 2018-08-03 LAB — HEPATITIS B CORE ANTIBODY, TOTAL: Hep B Core Total Ab: NONREACTIVE

## 2018-08-03 LAB — HEPATITIS C ANTIBODY
Hepatitis C Ab: NONREACTIVE
SIGNAL TO CUT-OFF: 0.06 (ref <1.00)

## 2018-08-03 LAB — HIV-1/2 AB - DIFFERENTIATION
HIV-1 antibody: POSITIVE — AB
HIV-2 Ab: NEGATIVE

## 2018-08-03 LAB — HEPATITIS B SURFACE ANTIGEN: Hepatitis B Surface Ag: NONREACTIVE

## 2018-08-03 LAB — HIV-1 GENOTYPE: HIV-1 Genotype: DETECTED — AB

## 2018-08-10 ENCOUNTER — Ambulatory Visit (INDEPENDENT_AMBULATORY_CARE_PROVIDER_SITE_OTHER): Payer: BLUE CROSS/BLUE SHIELD | Admitting: Family

## 2018-08-10 ENCOUNTER — Telehealth: Payer: Self-pay | Admitting: Pharmacy Technician

## 2018-08-10 ENCOUNTER — Ambulatory Visit (INDEPENDENT_AMBULATORY_CARE_PROVIDER_SITE_OTHER): Payer: BLUE CROSS/BLUE SHIELD | Admitting: Pharmacist

## 2018-08-10 ENCOUNTER — Encounter: Payer: Self-pay | Admitting: Family

## 2018-08-10 VITALS — BP 99/66 | HR 82 | Temp 98.4°F | Wt 137.0 lb

## 2018-08-10 DIAGNOSIS — B20 Human immunodeficiency virus [HIV] disease: Secondary | ICD-10-CM

## 2018-08-10 DIAGNOSIS — F1290 Cannabis use, unspecified, uncomplicated: Secondary | ICD-10-CM | POA: Diagnosis not present

## 2018-08-10 MED ORDER — BICTEGRAVIR-EMTRICITAB-TENOFOV 50-200-25 MG PO TABS: 1.0000 | ORAL_TABLET | Freq: Every day | ORAL | 2 refills | Status: DC

## 2018-08-10 MED FILL — BIKTARVY 50-200-25 MG TABS: 50-200-25 | 30 days supply | Qty: 30 | Fill #0

## 2018-08-10 NOTE — Assessment & Plan Note (Signed)
Currently uses marijuana daily. Discussed risks associated with continued use. He is not ready to quit at this time and is in the pre-contemplation stage of change. Will continue to monitor.

## 2018-08-10 NOTE — Assessment & Plan Note (Signed)
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 Genotype shows Subtype B with no mutations and wild type virus. Risk factor for acquiring HIV is MSM. [id number] negative. No current signs/symptoms of opportunistic infection. Discussed the pathogenesis, transmission, risk of progression if left untreated, and medication options. He is currently going through a state of adjustment with his new diagnosis and has not told anyone. Declines meeting our counselor Regina today but aware service is available. He will obtain his vaccination records for the next office visit. Plan to start Biktarvy daily. He met with our pharmacy staff regarding the medication and process of obtaining medication. Encouraged to ask questions as needed. Follow up office visit in 1 month or sooner if needed with lab work at that time.  

## 2018-08-10 NOTE — Progress Notes (Signed)
HPI: Matthew Petersen is a 18 y.o. male who presents to the [facility] clinic today to initiate care with Matthew Petersen for his newly diagnosed HIV infection.  Patient Active Problem List   Diagnosis Date Noted  . Marijuana use 08/10/2018  . HIV disease (HCC) 07/21/2018  . Erectile dysfunction 07/19/2018  . Intractable vomiting 02/22/2018  . Mood disorder of depressed type 12/29/2017  . Loss of weight 12/29/2017  . Exposure to STD 12/22/2017    Patient's Medications  New Prescriptions   BICTEGRAVIR-EMTRICITABINE-TENOFOVIR AF (BIKTARVY) 50-200-25 MG TABS TABLET    Take 1 tablet by mouth daily.  Previous Medications   No medications on file  Modified Medications   No medications on file  Discontinued Medications   No medications on file    Allergies: No Known Allergies  Past Medical History: No past medical history on file.  Social History: Social History   Socioeconomic History  . Marital status: Single    Spouse name: Not on file  . Number of children: 0  . Years of education: 81  . Highest education level: Not on file  Occupational History  . Occupation: Unemployed     Comment: Clinical cytogeneticist at [employer]  . Financial resource strain: Not on file  . Food insecurity:    Worry: Not on file    Inability: Not on file  . Transportation needs:    Medical: Not on file    Non-medical: Not on file  Tobacco Use  . Smoking status: Never Smoker  . Smokeless tobacco: Never Used  Substance and Sexual Activity  . Alcohol use: Not Currently    Comment: 12/27/2017 last usage  . Drug use: Yes    Frequency: 7.0 times per week    Types: Marijuana  . Sexual activity: Not on file  Lifestyle  . Physical activity:    Days per week: Not on file    Minutes per session: Not on file  . Stress: Not on file  Relationships  . Social connections:    Talks on phone: Not on file    Gets together: Not on file    Attends religious service: Not on file    Active member of club or  organization: Not on file    Attends meetings of clubs or organizations: Not on file    Relationship status: Not on file  Other Topics Concern  . Not on file  Social History Narrative  . Not on file    Labs: Lab Results  Component Value Date   HIV1RNAQUANT 139,000 (H) 07/25/2018   CD4TABS 760 07/25/2018    RPR and STI Lab Results  Component Value Date   LABRPR NON-REACTIVE 07/25/2018   LABRPR Non Reactive 01/20/2017    STI Results GC CT  07/25/2018 Negative Negative  03/14/2018 Negative Negative  03/14/2018 **POSITIVE**(A) Negative  12/22/2017 Negative Negative  01/20/2017 Negative Negative    Hepatitis B Lab Results  Component Value Date   HEPBSAB NON-REACTIVE 07/25/2018   HEPBSAG NON-REACTIVE 07/25/2018   HEPBCAB NON-REACTIVE 07/25/2018   Hepatitis C Lab Results  Component Value Date   HEPCAB NON-REACTIVE 07/25/2018   Hepatitis A Lab Results  Component Value Date   HAV REACTIVE (A) 07/25/2018   Lipids: Lab Results  Component Value Date   CHOL 182 (H) 07/25/2018   TRIG 69 07/25/2018   HDL 48 07/25/2018   CHOLHDL 3.8 07/25/2018   LDLCALC 118 (H) 07/25/2018    Current HIV Regimen: Treatment naive  Assessment: Matthew Petersen  is here today to initiate care with Matthew Petersen for his newly diagnosed HIV infection.  He is treatment naive with an initial HIV viral load of 139,000 and a CD4 count of 760.  No resistance mutations found on initial genotype. Will start patient on Biktarvy.  Explained that Matthew Petersen is a one pill once daily medication with or without food and the importance of not missing any doses. Explained resistance and how it develops and why it is so important to take Biktarvy daily and not skip days or doses. Counseled patient to take it around the same time each day. Counseled on what to do if dose is missed, if closer to missed dose take immediately, if closer to next dose then skip and resume normal schedule.   Cautioned on possible side effects the first  week or so including nausea, diarrhea, dizziness, and headaches but that they should resolve after the first couple of weeks. I reviewed patient medications and found no drug interactions. Counseled patient to separate Biktarvy from divalent cations including multivitamins. Discussed with patient to call clinic if he starts a new medication or herbal supplement. I gave the patient my card and told him to call me with any issues/questions/concerns.  He is insured and will fill at Matthew [facility]en. He will pick it up today.  Plan: - Start Biktarvy PO once daily  Cassie L. Kuppelweiser, PharmD, BCIDP, AAHIVP, CPP Infectious Diseases Clinical Pharmacist [facility] 08/10/2018, 4:27 PM

## 2018-08-10 NOTE — Patient Instructions (Addendum)
Nice to meet you!  We will get you started on a medication called Biktarvy that you will take once daily.   Please bring a copy of your immunizations for your records.   Please let us know if you have ANY questions as we are here to help you!  Plan for follow up in 1 month or sooner if needed. We will check your blood work at that appointment.

## 2018-08-10 NOTE — Telephone Encounter (Signed)
[facility] Patient Advocate Encounter   Was successful in obtaining a [place] copay card for [employer].  This copay card will make the patients copay $0.  The pharmacist spoke with the patient.    The billing information is as follows and has been shared with [facility].  RxBin: [id number] PCN: [id number] Member ID: [id number] Group ID: [id number]  Kathie Rhodes E. Dimas Aguas CPhT Specialty Pharmacy Patient [facility] Phone: [phone] Fax:  [phone]

## 2018-08-10 NOTE — Progress Notes (Signed)
Subjective:    Patient ID: Matthew Petersen, male    DOB: 08-15-2000, 18 y.o.   MRN: [id number]  Chief Complaint  Patient presents with  . HIV Positive/AIDS    HPI:  Matthew Petersen is a 18 y.o. male with no significant past medical history who presents today to establish care for newly diagnosed HIV disease.  Mr. Balaban completed his initial blood work on 07/25/18 with confirmation of HIV-1 and a viral load of 139,000 and CD4 nadir of 760. Genotype revealed Subtype B with no mutations indicating wild type virus. [id number] negative. Negative for gonorrhea, chlamydia, and syphilis. Quantiferon GOLD negative. He has no infection with Hepatitis B or C and is not serologically immune to Hepatitis B. Kidney function, liver function, electrolytes are all within normal ranges. Cholesterol with LDL of 118 and HDL of 48.    Mr. Kardell last tested negative for HIV in July 2019 and found about his positive status when blood work was drawn at his PCP's office. Risk factor for acquiring HIV includes MSM. He is doing okay since his diagnosis, but has not disclosed his status to anyone. He has a good support system around him. Currently he is unemployed and seeking work and a Surveyor, minerals at [employer] with goals of becoming a Librarian, academic. He does smoke marijuana daily. Denies other recreational/illicit drugs, alcohol or tobacco. No previous history of mental health issues. He is not currently sexually active. He has Pharmacist, community under his parents.   No Known Allergies   Outpatient Medications Prior to Visit  Medication Sig Dispense Refill  . Cetirizine HCl 10 MG CAPS Take 1 capsule by mouth daily.     No facility-administered medications prior to visit.      No past medical history on file.    No past surgical history on file.    Family History  Problem Relation Age of Onset  . Diabetes Mother   . Multiple sclerosis Father        Social History   Socioeconomic History  . Marital status: Single    Spouse name: Not on file  . Number of children: 0  . Years of education: 47  . Highest education level: Not on file  Occupational History  . Occupation: Unemployed     Comment: Agricultural engineer at [employer]  . Financial resource strain: Not on file  . Food insecurity:    Worry: Not on file    Inability: Not on file  . Transportation needs:    Medical: Not on file    Non-medical: Not on file  Tobacco Use  . Smoking status: Never Smoker  . Smokeless tobacco: Never Used  Substance and Sexual Activity  . Alcohol use: Not Currently    Comment: 12/27/2017 last usage  . Drug use: Yes    Frequency: 7.0 times per week    Types: Marijuana  . Sexual activity: Not on file  Lifestyle  . Physical activity:    Days per week: Not on file    Minutes per session: Not on file  . Stress: Not on file  Relationships  . Social connections:    Talks on phone: Not on file    Gets together: Not on file    Attends religious service: Not on file    Active member of club or organization: Not on file    Attends meetings of clubs or organizations: Not on file    Relationship status: Not  on file  . Intimate partner violence:    Fear of current or ex partner: Not on file    Emotionally abused: Not on file    Physically abused: Not on file    Forced sexual activity: Not on file  Other Topics Concern  . Not on file  Social History Narrative  . Not on file    Review of Systems  Constitutional: Negative for appetite change, chills, fatigue, fever and unexpected weight change.  Eyes: Negative for visual disturbance.  Respiratory: Negative for cough, chest tightness, shortness of breath and wheezing.   Cardiovascular: Negative for chest pain and leg swelling.  Gastrointestinal: Negative for abdominal pain, constipation, diarrhea, nausea and vomiting.  Genitourinary: Negative for dysuria, flank pain, frequency,  genital sores, hematuria and urgency.  Skin: Negative for rash.  Allergic/Immunologic: Negative for immunocompromised state.  Neurological: Negative for dizziness and headaches.       Objective:    BP 99/66   Pulse 82   Temp 98.4 F (36.9 C)   Wt 137 lb (62.1 kg)   BMI 20.53 kg/m  Nursing note and vital signs reviewed.  Physical Exam Constitutional:      General: He is not in acute distress.    Appearance: He is well-developed.  Eyes:     Conjunctiva/sclera: Conjunctivae normal.  Neck:     Musculoskeletal: Neck supple.  Cardiovascular:     Rate and Rhythm: Normal rate and regular rhythm.     Heart sounds: Normal heart sounds. No murmur. No friction rub. No gallop.   Pulmonary:     Effort: Pulmonary effort is normal. No respiratory distress.     Breath sounds: Normal breath sounds. No wheezing or rales.  Chest:     Chest wall: No tenderness.  Abdominal:     General: Bowel sounds are normal.     Palpations: Abdomen is soft.     Tenderness: There is no abdominal tenderness.  Lymphadenopathy:     Cervical: No cervical adenopathy.  Skin:    General: Skin is warm and dry.     Findings: No rash.  Neurological:     Mental Status: He is alert and oriented to person, place, and time.  Psychiatric:        Behavior: Behavior normal.        Thought Content: Thought content normal.        Judgment: Judgment normal.         Assessment & Plan:   Problem List Items Addressed This Visit      Other   HIV disease (HCC) - Primary    Mr. Arista is newly diagnosed with HIV-1 disease with initial viral load of 139,000 and CD4 nadir of 760. Genotype shows Subtype B with no mutations and wild type virus. Risk factor for acquiring HIV is MSM. HLA-B5701 negative. No current signs/symptoms of opportunistic infection. Discussed the pathogenesis, transmission, risk of progression if left untreated, and medication options. He is currently going through a state of adjustment with his new  diagnosis and has not told anyone. Declines meeting our counselor Regina today but aware service is available. He will obtain his vaccination records for the next office visit. Plan to start Biktarvy daily. He met with our pharmacy staff regarding the medication and process of obtaining medication. Encouraged to ask questions as needed. Follow up office visit in 1 month or sooner if needed with lab work at that time.       Relevant Medications   bictegravir-emtricitabine-tenofovir AF (  BIKTARVY) 50-200-25 MG TABS tablet   Marijuana use    Currently uses marijuana daily. Discussed risks associated with continued use. He is not ready to quit at this time and is in the pre-contemplation stage of change. Will continue to monitor.           I have discontinued Avner T. Neisen's Cetirizine HCl. I am also having him start on bictegravir-emtricitabine-tenofovir AF.   Meds ordered this encounter  Medications  . bictegravir-emtricitabine-tenofovir AF (BIKTARVY) 50-200-25 MG TABS tablet    Sig: Take 1 tablet by mouth daily.    Dispense:  30 tablet    Refill:  2    Order Specific Question:   Supervising Provider    Answer:   Carlyle Basques [[id number]]     Follow-up: Return in about 1 month (around 09/08/2018).    Terri Piedra, MSN, FNP-C Nurse Practitioner [facility] Office phone: [phone] Pager: [place] number: [phone]

## 2018-08-13 ENCOUNTER — Encounter (HOSPITAL_COMMUNITY): Payer: Self-pay | Admitting: Emergency Medicine

## 2018-08-13 ENCOUNTER — Emergency Department (HOSPITAL_COMMUNITY): Payer: BLUE CROSS/BLUE SHIELD

## 2018-08-13 ENCOUNTER — Emergency Department (HOSPITAL_COMMUNITY)
Admission: EM | Admit: 2018-08-13 | Discharge: 2018-08-13 | Disposition: A | Discharge status: Home / Self Care | Payer: BLUE CROSS/BLUE SHIELD | Attending: Emergency Medicine | Admitting: Emergency Medicine

## 2018-08-13 DIAGNOSIS — R1110 Vomiting, unspecified: Secondary | ICD-10-CM | POA: Diagnosis present

## 2018-08-13 DIAGNOSIS — E876 Hypokalemia: Secondary | ICD-10-CM | POA: Diagnosis not present

## 2018-08-13 DIAGNOSIS — B20 Human immunodeficiency virus [HIV] disease: Secondary | ICD-10-CM | POA: Diagnosis not present

## 2018-08-13 HISTORY — DX: Asymptomatic human immunodeficiency virus (hiv) infection status: Z21

## 2018-08-13 HISTORY — DX: Human immunodeficiency virus (HIV) disease: B20

## 2018-08-13 LAB — RAPID URINE DRUG SCREEN, HOSP PERFORMED
Amphetamines: NOT DETECTED
BENZODIAZEPINES: NOT DETECTED
Barbiturates: NOT DETECTED
Cocaine: NOT DETECTED
Opiates: NOT DETECTED
Tetrahydrocannabinol: POSITIVE — AB

## 2018-08-13 LAB — URINALYSIS, ROUTINE W REFLEX MICROSCOPIC
Bacteria, UA: NONE SEEN
Bilirubin Urine: NEGATIVE
Glucose, UA: NEGATIVE mg/dL
Hgb urine dipstick: NEGATIVE
Ketones, ur: 80 mg/dL — AB
Leukocytes,Ua: NEGATIVE
NITRITE: NEGATIVE
PH: 6 (ref 5.0–8.0)
Protein, ur: 30 mg/dL — AB
Specific Gravity, Urine: >1.046 — ABNORMAL HIGH (ref 1.005–1.030)

## 2018-08-13 LAB — CBC
HCT: 48.5 % (ref 39.0–52.0)
Hemoglobin: 15.3 g/dL (ref 13.0–17.0)
MCH: 25.4 pg — ABNORMAL LOW (ref 26.0–34.0)
MCHC: 31.5 g/dL (ref 30.0–36.0)
MCV: 80.4 fL (ref 80.0–100.0)
PLATELETS: 201 10*3/uL (ref 150–400)
RBC: 6.03 MIL/uL — AB (ref 4.22–5.81)
RDW: 13.5 % (ref 11.5–15.5)
WBC: 6.3 10*3/uL (ref 4.0–10.5)
nRBC: 0 % (ref 0.0–0.2)

## 2018-08-13 LAB — COMPREHENSIVE METABOLIC PANEL
ALT: 19 U/L (ref 0–44)
ANION GAP: 16 — AB (ref 5–15)
AST: 25 U/L (ref 15–41)
Albumin: 5.1 g/dL — ABNORMAL HIGH (ref 3.5–5.0)
Alkaline Phosphatase: 64 U/L (ref 38–126)
BUN: 14 mg/dL (ref 6–20)
CALCIUM: 10 mg/dL (ref 8.9–10.3)
CO2: 22 mmol/L (ref 22–32)
CREATININE: 1.25 mg/dL — AB (ref 0.61–1.24)
Chloride: 102 mmol/L (ref 98–111)
GFR calc non Af Amer: >60 mL/min (ref >60)
Glucose, Bld: 109 mg/dL — ABNORMAL HIGH (ref 70–99)
Potassium: 3.1 mmol/L — ABNORMAL LOW (ref 3.5–5.1)
Sodium: 140 mmol/L (ref 135–145)
Total Bilirubin: 2.1 mg/dL — ABNORMAL HIGH (ref 0.3–1.2)
Total Protein: 8.5 g/dL — ABNORMAL HIGH (ref 6.5–8.1)

## 2018-08-13 LAB — LACTIC ACID, PLASMA: Lactic Acid, Venous: 1.8 mmol/L (ref 0.5–1.9)

## 2018-08-13 LAB — LIPASE, BLOOD: Lipase: 20 U/L (ref 11–51)

## 2018-08-13 MED ORDER — SODIUM CHLORIDE 0.9 % IV BOLUS (SEPSIS)
1000.0000 mL | Freq: Once | INTRAVENOUS | Status: AC
  Administered 2018-08-13: 1000 mL via INTRAVENOUS

## 2018-08-13 MED ORDER — ONDANSETRON HCL 4 MG/2ML IJ SOLN
4.0000 mg | Freq: Once | INTRAMUSCULAR | Status: AC
  Administered 2018-08-13: 4 mg via INTRAVENOUS
  Filled 2018-08-13: qty 2

## 2018-08-13 MED ORDER — METOCLOPRAMIDE HCL 5 MG/ML IJ SOLN
10.0000 mg | Freq: Once | INTRAMUSCULAR | Status: AC
  Administered 2018-08-13: 10 mg via INTRAVENOUS
  Filled 2018-08-13: qty 2

## 2018-08-13 MED ORDER — METOCLOPRAMIDE HCL 10 MG PO TABS: 10.0000 mg | ORAL_TABLET | Freq: Four times a day (QID) | ORAL | 0 refills | Status: DC

## 2018-08-13 MED ORDER — POTASSIUM CHLORIDE CRYS ER 20 MEQ PO TBCR
40.0000 meq | EXTENDED_RELEASE_TABLET | Freq: Once | ORAL | Status: AC
  Administered 2018-08-13: 40 meq via ORAL
  Filled 2018-08-13: qty 2

## 2018-08-13 MED ORDER — SODIUM CHLORIDE 0.9 % IV SOLN: 1000.0000 mL | INTRAVENOUS | Status: DC

## 2018-08-13 MED ORDER — SODIUM CHLORIDE 0.9% FLUSH: 3.0000 mL | Freq: Once | INTRAVENOUS | Status: DC

## 2018-08-13 MED ORDER — DIPHENHYDRAMINE HCL 50 MG/ML IJ SOLN
25.0000 mg | Freq: Once | INTRAMUSCULAR | Status: AC
  Administered 2018-08-13: 25 mg via INTRAVENOUS
  Filled 2018-08-13: qty 1

## 2018-08-13 MED ORDER — IOHEXOL 300 MG/ML  SOLN
100.0000 mL | Freq: Once | INTRAMUSCULAR | Status: AC | PRN
  Administered 2018-08-13: 100 mL via INTRAVENOUS

## 2018-08-13 NOTE — Discharge Instructions (Signed)
Return if any problems.

## 2018-08-13 NOTE — ED Notes (Signed)
Patient transported to CT 

## 2018-08-13 NOTE — ED Provider Notes (Signed)
[facility] EMERGENCY DEPARTMENT Provider Note   CSN: [id number] Arrival date & time: 08/13/18  0548    History   Chief Complaint Chief Complaint  Patient presents with  . Emesis    HPI Matthew Petersen is a 18 y.o. male.     The history is provided by the patient. No language interpreter was used.  Emesis  Severity:  Moderate Duration:  2 days Timing:  Constant Quality:  Unable to specify Progression:  Worsening Chronicity:  New Recent urination:  Normal Relieved by:  Nothing Worsened by:  Nothing Ineffective treatments:  None tried Associated symptoms: no abdominal pain   Risk factors: no prior abdominal surgery and no sick contacts   Pt recently diagnosed with HIV.  Pt unable to take his medications due to vomiting.  Pt reports he is seeing the ID clinic.   Pt's Mother concerned, Pt is losing weight.  Past Medical History:  Diagnosis Date  . HIV (human immunodeficiency virus infection) [facility])     Patient Active Problem List   Diagnosis Date Noted  . Marijuana use 08/10/2018  . HIV disease (HCC) 07/21/2018  . Erectile dysfunction 07/19/2018  . Intractable vomiting 02/22/2018  . Mood disorder of depressed type 12/29/2017  . Loss of weight 12/29/2017  . Exposure to STD 12/22/2017    History reviewed. No pertinent surgical history.      Home Medications    Prior to Admission medications   Medication Sig Start Date End Date Taking? Authorizing Provider  bictegravir-emtricitabine-tenofovir AF (BIKTARVY) 50-200-25 MG TABS tablet Take 1 tablet by mouth daily. 08/10/18  Yes Veryl Speak, FNP  metoCLOPramide (REGLAN) 10 MG tablet Take 1 tablet (10 mg total) by mouth every 6 (six) hours. 08/13/18   Elson Areas, PA-C    Family History Family History  Problem Relation Age of Onset  . Diabetes Mother   . Multiple sclerosis Father     Social History Social History   Tobacco Use  . Smoking status: Never Smoker  . Smokeless  tobacco: Never Used  Substance Use Topics  . Alcohol use: Not Currently  . Drug use: Yes    Frequency: 7.0 times per week    Types: Marijuana     Allergies   Patient has no known allergies.   Review of Systems Review of Systems  Gastrointestinal: Positive for vomiting. Negative for abdominal pain.  All other systems reviewed and are negative.    Physical Exam Updated Vital Signs BP 116/75   Pulse 70   Temp 98.6 F (37 C) (Oral)   Resp 20   Ht 5\' 8"  (1.727 m)   Wt 62.1 kg   SpO2 99%   BMI 20.83 kg/m   Physical Exam Vitals signs and nursing note reviewed.  Constitutional:      Appearance: He is well-developed.  HENT:     Head: Normocephalic.     Right Ear: Tympanic membrane normal.     Left Ear: Tympanic membrane normal.     Nose: Nose normal.  Eyes:     Extraocular Movements: Extraocular movements intact.     Pupils: Pupils are equal, round, and reactive to light.  Neck:     Musculoskeletal: Normal range of motion.  Pulmonary:     Effort: Pulmonary effort is normal.  Abdominal:     General: Abdomen is flat. There is no distension.  Musculoskeletal: Normal range of motion.  Skin:    General: Skin is warm.  Neurological:  Mental Status: He is alert and oriented to person, place, and time.  Psychiatric:        Mood and Affect: Mood normal.      ED Treatments / Results  Labs (all labs ordered are listed, but only abnormal results are displayed) Labs Reviewed  COMPREHENSIVE METABOLIC PANEL - Abnormal; Notable for the following components:      Result Value   Potassium 3.1 (*)    Glucose, Bld 109 (*)    Creatinine, Ser 1.25 (*)    Total Protein 8.5 (*)    Albumin 5.1 (*)    Total Bilirubin 2.1 (*)    Anion gap 16 (*)    All other components within normal limits  CBC - Abnormal; Notable for the following components:   RBC 6.03 (*)    MCH 25.4 (*)    All other components within normal limits  URINALYSIS, ROUTINE W REFLEX MICROSCOPIC -  Abnormal; Notable for the following components:   Specific Gravity, Urine >1.046 (*)    Ketones, ur 80 (*)    Protein, ur 30 (*)    All other components within normal limits  RAPID URINE DRUG SCREEN, HOSP PERFORMED - Abnormal; Notable for the following components:   Tetrahydrocannabinol POSITIVE (*)    All other components within normal limits  LIPASE, BLOOD  LACTIC ACID, PLASMA  LACTIC ACID, PLASMA    EKG None  Radiology Ct Abdomen Pelvis W Contrast  Result Date: 08/13/2018 CLINICAL DATA:  Pt reports emesis x 3 days,generalized abd pain. Pt recently started on antiviral medications. EXAM: CT ABDOMEN AND PELVIS WITH CONTRAST TECHNIQUE: Multidetector CT imaging of the abdomen and pelvis was performed using the standard protocol following bolus administration of intravenous contrast. CONTRAST:  OMNIPAQUE IOHEXOL 300 MG/ML  SOLN COMPARISON:  None. FINDINGS: Lower chest: Clear lung bases. Hepatobiliary: No focal liver abnormality is seen. No gallstones, gallbladder wall thickening, or biliary dilatation. Pancreas: Unremarkable. No pancreatic ductal dilatation or surrounding inflammatory changes. Spleen: Normal in size without focal abnormality. Adrenals/Urinary Tract: Adrenal glands are unremarkable. Kidneys are normal, without renal calculi, focal lesion, or hydronephrosis. Bladder is unremarkable. Stomach/Bowel: Stomach is unremarkable. Small bowel and colon are normal in caliber. No wall thickening or inflammation. Normal appendix is seen extending medially and inferiorly from the cecal tip. Vascular/Lymphatic: No significant vascular findings are present. No enlarged abdominal or pelvic lymph nodes. Reproductive: Unremarkable. Other: No abdominal wall hernia or abnormality. No abdominopelvic ascites. Musculoskeletal: Normal. IMPRESSION: 1. Normal enhanced CT scan of the abdomen and pelvis. Electronically Signed   By: Amie Portland M.D.   On: 08/13/2018 10:10    Procedures Procedures  (including critical care time)  Medications Ordered in ED Medications  sodium chloride flush (NS) 0.9 % injection 3 mL (has no administration in time range)  sodium chloride 0.9 % bolus 1,000 mL (0 mLs Intravenous Stopped 08/13/18 0819)    Followed by  sodium chloride 0.9 % bolus 1,000 mL (0 mLs Intravenous Stopped 08/13/18 0819)    Followed by  0.9 %  sodium chloride infusion (has no administration in time range)  metoCLOPramide (REGLAN) injection 10 mg (10 mg Intravenous Given 08/13/18 0708)  diphenhydrAMINE (BENADRYL) injection 25 mg (25 mg Intravenous Given 08/13/18 0708)  potassium chloride SA (K-DUR,KLOR-CON) CR tablet 40 mEq (40 mEq Oral Given 08/13/18 1013)  iohexol (OMNIPAQUE) 300 MG/ML solution 100 mL (100 mLs Intravenous Contrast Given 08/13/18 0933)  ondansetron (ZOFRAN) injection 4 mg (4 mg Intravenous Given 08/13/18 1117)  Initial Impression / Assessment and Plan / ED Course  I have reviewed the triage vital signs and the nursing notes.  Pertinent labs & imaging results that were available during my care of the patient were reviewed by me and considered in my medical decision making (see chart for details).        Pt given Iv fluids x 2 liters, reglan, benadryl and zofran.  Pt is able to tolerate po fluids.  Pt given 40 mg of potassium.   Pt advised to stop using THC (marijuana)  Pt advised to follow up with is MD.    Final Clinical Impressions(s) / ED Diagnoses   Final diagnoses:  Vomiting, intractability of vomiting not specified, presence of nausea not specified, unspecified vomiting type  Hypokalemia    ED Discharge Orders         Ordered    metoCLOPramide (REGLAN) 10 MG tablet  Every 6 hours     08/13/18 1209        An After Visit Summary was printed and given to the patient.    Elson AreasSofia, Kalep Full K, [place] JerseyPA-C 08/13/18 1215    Little, Ambrose Finlandachel Morgan, MD 08/13/18 1311

## 2018-08-13 NOTE — ED Triage Notes (Addendum)
Pt reports emesis x 3 days, unable to give estimated number. Generalized abd pain. Pt recently started on antiviral medications.

## 2018-08-16 ENCOUNTER — Other Ambulatory Visit: Payer: Self-pay

## 2018-08-16 ENCOUNTER — Encounter (HOSPITAL_COMMUNITY): Payer: Self-pay

## 2018-08-16 ENCOUNTER — Encounter: Payer: Self-pay | Admitting: Family

## 2018-08-16 ENCOUNTER — Emergency Department (HOSPITAL_COMMUNITY)
Admission: EM | Admit: 2018-08-16 | Discharge: 2018-08-17 | Disposition: A | Discharge status: Home / Self Care | Payer: BLUE CROSS/BLUE SHIELD | Attending: Emergency Medicine | Admitting: Emergency Medicine

## 2018-08-16 DIAGNOSIS — R112 Nausea with vomiting, unspecified: Secondary | ICD-10-CM | POA: Insufficient documentation

## 2018-08-16 DIAGNOSIS — E876 Hypokalemia: Secondary | ICD-10-CM | POA: Insufficient documentation

## 2018-08-16 DIAGNOSIS — R1013 Epigastric pain: Secondary | ICD-10-CM

## 2018-08-16 DIAGNOSIS — Z79899 Other long term (current) drug therapy: Secondary | ICD-10-CM | POA: Insufficient documentation

## 2018-08-16 LAB — COMPREHENSIVE METABOLIC PANEL
ALBUMIN: 5 g/dL (ref 3.5–5.0)
ALT: 16 U/L (ref 0–44)
AST: 23 U/L (ref 15–41)
Alkaline Phosphatase: 59 U/L (ref 38–126)
Anion gap: 13 (ref 5–15)
BUN: 12 mg/dL (ref 6–20)
CALCIUM: 10 mg/dL (ref 8.9–10.3)
CO2: 27 mmol/L (ref 22–32)
Chloride: 97 mmol/L — ABNORMAL LOW (ref 98–111)
Creatinine, Ser: 1.08 mg/dL (ref 0.61–1.24)
GFR calc Af Amer: >60 mL/min (ref >60)
GFR calc non Af Amer: >60 mL/min (ref >60)
Glucose, Bld: 99 mg/dL (ref 70–99)
Potassium: 2.9 mmol/L — ABNORMAL LOW (ref 3.5–5.1)
Sodium: 137 mmol/L (ref 135–145)
Total Bilirubin: 2.5 mg/dL — ABNORMAL HIGH (ref 0.3–1.2)
Total Protein: 8.1 g/dL (ref 6.5–8.1)

## 2018-08-16 LAB — CBC
HCT: 49.6 % (ref 39.0–52.0)
Hemoglobin: 15.7 g/dL (ref 13.0–17.0)
MCH: 24.8 pg — ABNORMAL LOW (ref 26.0–34.0)
MCHC: 31.7 g/dL (ref 30.0–36.0)
MCV: 78.5 fL — ABNORMAL LOW (ref 80.0–100.0)
PLATELETS: 203 10*3/uL (ref 150–400)
RBC: 6.32 MIL/uL — ABNORMAL HIGH (ref 4.22–5.81)
RDW: 12.6 % (ref 11.5–15.5)
WBC: 5.2 10*3/uL (ref 4.0–10.5)
nRBC: 0 % (ref 0.0–0.2)

## 2018-08-16 LAB — LIPASE, BLOOD: Lipase: 21 U/L (ref 11–51)

## 2018-08-16 MED ORDER — SODIUM CHLORIDE 0.9% FLUSH
3.0000 mL | Freq: Once | INTRAVENOUS | Status: AC
  Administered 2018-08-17: 3 mL via INTRAVENOUS

## 2018-08-16 NOTE — ED Triage Notes (Signed)
Pt reports sharp chest pain and vomiting since Tuesday. Pt seen here a few days ago for the same and was told it was from smoking marijuana. Pt a,o, nad noted

## 2018-08-17 ENCOUNTER — Emergency Department (HOSPITAL_COMMUNITY): Payer: BLUE CROSS/BLUE SHIELD

## 2018-08-17 LAB — URINALYSIS, ROUTINE W REFLEX MICROSCOPIC
Bacteria, UA: NONE SEEN
Bilirubin Urine: NEGATIVE
GLUCOSE, UA: NEGATIVE mg/dL
Hgb urine dipstick: NEGATIVE
Ketones, ur: 20 mg/dL — AB
Leukocytes,Ua: NEGATIVE
Nitrite: NEGATIVE
Protein, ur: 30 mg/dL — AB
Specific Gravity, Urine: 1.031 — ABNORMAL HIGH (ref 1.005–1.030)
pH: 6 (ref 5.0–8.0)

## 2018-08-17 MED ORDER — FAMOTIDINE IN NACL 20-0.9 MG/50ML-% IV SOLN
20.0000 mg | Freq: Once | INTRAVENOUS | Status: AC
  Administered 2018-08-17: 20 mg via INTRAVENOUS
  Filled 2018-08-17: qty 50

## 2018-08-17 MED ORDER — POTASSIUM CHLORIDE 10 MEQ/100ML IV SOLN
10.0000 meq | Freq: Once | INTRAVENOUS | Status: AC
  Administered 2018-08-17: 10 meq via INTRAVENOUS
  Filled 2018-08-17: qty 100

## 2018-08-17 MED ORDER — METOCLOPRAMIDE HCL 5 MG/ML IJ SOLN
10.0000 mg | Freq: Once | INTRAMUSCULAR | Status: AC
  Administered 2018-08-17: 10 mg via INTRAVENOUS
  Filled 2018-08-17: qty 2

## 2018-08-17 MED ORDER — ONDANSETRON HCL 4 MG/2ML IJ SOLN
4.0000 mg | Freq: Once | INTRAMUSCULAR | Status: DC
  Administered 2018-08-17: 4 mg via INTRAVENOUS
  Filled 2018-08-17: qty 2

## 2018-08-17 MED ORDER — FAMOTIDINE 20 MG PO TABS: 20.0000 mg | ORAL_TABLET | Freq: Two times a day (BID) | ORAL | 0 refills | Status: DC

## 2018-08-17 MED ORDER — DIPHENHYDRAMINE HCL 50 MG/ML IJ SOLN
25.0000 mg | Freq: Once | INTRAMUSCULAR | Status: AC
  Administered 2018-08-17: 25 mg via INTRAVENOUS
  Filled 2018-08-17: qty 1

## 2018-08-17 MED ORDER — POTASSIUM CHLORIDE CRYS ER 20 MEQ PO TBCR
40.0000 meq | EXTENDED_RELEASE_TABLET | Freq: Once | ORAL | Status: AC
  Administered 2018-08-17: 40 meq via ORAL
  Filled 2018-08-17: qty 2

## 2018-08-17 MED ORDER — SODIUM CHLORIDE 0.9 % IV BOLUS
2000.0000 mL | Freq: Once | INTRAVENOUS | Status: AC
  Administered 2018-08-17: 2000 mL via INTRAVENOUS

## 2018-08-17 NOTE — ED Notes (Signed)
Patient is resting comfortably. 

## 2018-08-17 NOTE — ED Notes (Signed)
Patient transported to X-ray 

## 2018-08-17 NOTE — ED Notes (Signed)
Pt tolerated PO gingerale. No episodes of vomiting x2 hours

## 2018-08-17 NOTE — Discharge Instructions (Signed)
Continue using the nausea medication you were prescribed a few days ago in addition to this please take Pepcid twice daily before breakfast and at bedtime to help with abdominal discomfort.  Start with clear liquids and advance your diet slowly and then eat small frequent meals to help prevent worsening nausea and vomiting.  Follow-up with your primary care doctor.  Return to the emergency department if you develop fevers, new or worsening abdominal pain, persistent vomiting despite medications or any other new or concerning symptoms.  Please avoid using marijuana as it may cause your nausea and vomiting to return.

## 2018-08-17 NOTE — ED Notes (Signed)
ED Provider at bedside. 

## 2018-08-17 NOTE — ED Provider Notes (Addendum)
[facility] EMERGENCY DEPARTMENT Provider Note   CSN: [id number] Arrival date & time: 08/16/18  2119    History   Chief Complaint Chief Complaint  Patient presents with  . Emesis  . Chest Pain    HPI Matthew Petersen is a 18 y.o. male.     Matthew Petersen is a 18 y.o. male with a history of HIV, marijuana use, intractable vomiting, erectile dysfunction and depression, who presents to the emergency department for evaluation of persistent nausea and vomiting since Tuesday.  Patient reports he has been unable to keep anything down since yesterday morning.  He reports intermittent epigastric and left upper quadrant abdominal pains that radiate up towards the chest occasionally but no persistent chest pain.  He reports that when he starts dry heaving and has repeat episodes of vomiting he feels short of breath but this then goes away after he vomits.  He denies productive cough.  He has had some chills but no documented fever.  No diarrhea or blood in the stools.  Had similar presentation a few days ago and this was thought to be related to his marijuana use, has history of prior ED visits for similar cyclic vomiting.  Symptoms were controlled with Reglan and he was given IV fluids.  Reports he was taking Reglan at home but it was no longer helping and he has not been able to keep anything down.     Past Medical History:  Diagnosis Date  . HIV (human immunodeficiency virus infection) [facility])     Patient Active Problem List   Diagnosis Date Noted  . Marijuana use 08/10/2018  . HIV disease (HCC) 07/21/2018  . Erectile dysfunction 07/19/2018  . Intractable vomiting 02/22/2018  . Mood disorder of depressed type 12/29/2017  . Loss of weight 12/29/2017  . Exposure to STD 12/22/2017    History reviewed. No pertinent surgical history.      Home Medications    Prior to Admission medications   Medication Sig Start Date End Date Taking? Authorizing Provider    bictegravir-emtricitabine-tenofovir AF (BIKTARVY) 50-200-25 MG TABS tablet Take 1 tablet by mouth daily. 08/10/18   Veryl Speak, FNP  famotidine (PEPCID) 20 MG tablet Take 1 tablet (20 mg total) by mouth 2 (two) times daily. 08/17/18   Dartha Lodge, PA-C  metoCLOPramide (REGLAN) 10 MG tablet Take 1 tablet (10 mg total) by mouth every 6 (six) hours. 08/13/18   Elson Areas, PA-C    Family History Family History  Problem Relation Age of Onset  . Diabetes Mother   . Multiple sclerosis Father     Social History Social History   Tobacco Use  . Smoking status: Never Smoker  . Smokeless tobacco: Never Used  Substance Use Topics  . Alcohol use: Not Currently  . Drug use: Yes    Frequency: 7.0 times per week    Types: Marijuana     Allergies   Patient has no known allergies.   Review of Systems Review of Systems  Constitutional: Positive for chills. Negative for fever.  HENT: Negative.   Eyes: Negative for visual disturbance.  Respiratory: Positive for shortness of breath. Negative for cough.   Cardiovascular: Negative for chest pain.  Gastrointestinal: Positive for abdominal pain, nausea and vomiting. Negative for blood in stool, constipation and diarrhea.  Genitourinary: Negative for dysuria, flank pain, frequency and hematuria.  Musculoskeletal: Negative for arthralgias and myalgias.  Skin: Negative for color change and rash.  Neurological: Negative for  dizziness, syncope and light-headedness.  All other systems reviewed and are negative.    Physical Exam Updated Vital Signs BP (!) 128/108 (BP Location: Right Arm)   Pulse (!) 111   Temp 98.9 F (37.2 C) (Oral)   Resp 18   Ht 5\' 9"  (1.753 m)   Wt 56.7 kg   SpO2 96%   BMI 18.46 kg/m   Physical Exam Vitals signs and nursing note reviewed.  Constitutional:      General: He is not in acute distress.    Appearance: He is well-developed and normal weight. He is not ill-appearing or diaphoretic.  HENT:      Head: Normocephalic and atraumatic.     Mouth/Throat:     Mouth: Mucous membranes are moist.     Pharynx: Oropharynx is clear.  Eyes:     General:        Right eye: No discharge.        Left eye: No discharge.     Pupils: Pupils are equal, round, and reactive to light.  Neck:     Musculoskeletal: Neck supple.  Cardiovascular:     Rate and Rhythm: Regular rhythm. Tachycardia present.     Pulses: Normal pulses.     Heart sounds: Normal heart sounds. No murmur. No friction rub. No gallop.   Pulmonary:     Effort: Pulmonary effort is normal. No respiratory distress.     Breath sounds: Normal breath sounds. No wheezing or rales.     Comments: Respirations equal and unlabored, patient able to speak in full sentences, lungs clear to auscultation bilaterally Abdominal:     General: Abdomen is flat. Bowel sounds are normal. There is no distension.     Palpations: Abdomen is soft. There is no mass.     Tenderness: There is no abdominal tenderness. There is no guarding.     Comments: Abdomen soft, nondistended, bowel sounds present throughout, there is some epigastric tenderness without guarding, all other quadrants nontender to palpation.  No rebound tenderness or rigidity, no peritoneal signs.  Musculoskeletal:        General: No deformity.  Skin:    General: Skin is warm and dry.     Capillary Refill: Capillary refill takes less than 2 seconds.  Neurological:     Mental Status: He is alert.     Coordination: Coordination normal.     Comments: Speech is clear, able to follow commands Moves extremities without ataxia, coordination intact  Psychiatric:        Mood and Affect: Mood normal.        Behavior: Behavior normal.      ED Treatments / Results  Labs (all labs ordered are listed, but only abnormal results are displayed) Labs Reviewed  COMPREHENSIVE METABOLIC PANEL - Abnormal; Notable for the following components:      Result Value   Potassium 2.9 (*)    Chloride 97 (*)     Total Bilirubin 2.5 (*)    All other components within normal limits  CBC - Abnormal; Notable for the following components:   RBC 6.32 (*)    MCV 78.5 (*)    MCH 24.8 (*)    All other components within normal limits  URINALYSIS, ROUTINE W REFLEX MICROSCOPIC - Abnormal; Notable for the following components:   Color, Urine AMBER (*)    APPearance HAZY (*)    Specific Gravity, Urine 1.031 (*)    Ketones, ur 20 (*)    Protein, ur 30 (*)  All other components within normal limits  LIPASE, BLOOD    EKG EKG Interpretation  Date/Time:  Tuesday August 16 2018 21:28:42 EST Ventricular Rate:  90 PR Interval:  142 QRS Duration: 96 QT Interval:  386 QTC Calculation: 472 R Axis:   76 Text Interpretation:  Sinus rhythm with marked sinus arrhythmia T wave abnormality, consider inferior ischemia Prolonged QT Abnormal ECG Interpretation limited secondary to artifact Confirmed by Zadie Rhine ([id number]) on 08/17/2018 4:20:30 AM   Radiology Dg Abd Acute 2+v W 1v Chest  Result Date: 08/17/2018 CLINICAL DATA:  Sharp chest pain and vomiting. EXAM: DG ABDOMEN ACUTE W/ 1V CHEST COMPARISON:  08/13/2018 abdominal CT FINDINGS: There is no evidence of dilated bowel loops or free intraperitoneal air. No radiopaque calculi or other significant radiographic abnormality is seen. Heart size and mediastinal contours are within normal limits. Both lungs are clear. IMPRESSION: Negative abdominal radiographs.  No acute cardiopulmonary disease. Electronically Signed   By: Marnee Spring M.D.   On: 08/17/2018 05:14    Procedures Procedures (including critical care time)  Medications Ordered in ED Medications  sodium chloride flush (NS) 0.9 % injection 3 mL (3 mLs Intravenous Given 08/17/18 0353)  sodium chloride 0.9 % bolus 2,000 mL (0 mLs Intravenous Stopped 08/17/18 0704)  famotidine (PEPCID) IVPB 20 mg premix (0 mg Intravenous Stopped 08/17/18 0513)  metoCLOPramide (REGLAN) injection 10 mg (10 mg Intravenous Given  08/17/18 0415)  diphenhydrAMINE (BENADRYL) injection 25 mg (25 mg Intravenous Given 08/17/18 0414)  potassium chloride 10 mEq in 100 mL IVPB (0 mEq Intravenous Stopped 08/17/18 0542)  potassium chloride SA (K-DUR,KLOR-CON) CR tablet 40 mEq (40 mEq Oral Given 08/17/18 0523)     Initial Impression / Assessment and Plan / ED Course  I have reviewed the triage vital signs and the nursing notes.  Pertinent labs & imaging results that were available during my care of the patient were reviewed by me and considered in my medical decision making (see chart for details).  Patient presents for evaluation of nausea, vomiting and epigastric abdominal pain, symptoms started yesterday.  He was seen in the emergency department for a similar episode on Saturday was treated with meds and improved.  This was thought to be related to his marijuana use he has had similar issues with cyclic vomiting in the past.  He denies fevers, reports he has had intermittent epigastric abdominal pains.  Reports when he had several episodes of vomiting he started to feel short of breath but is not having any shortness of breath or chest pain now.  Patient has some tenderness in the epigastric and left upper quadrant without guarding, no peritoneal signs on exam.  Suspect that this is once again related to cyclic vomiting in the setting of marijuana use.  Will treat symptomatically and check abdominal labs.  Given patient did report some shortness of breath and has had multiple episodes of vomiting will check acute abdominal series to rule out aspiration or abnormal bowel gas pattern.   Labs overall reassuring, no leukocytosis, normal hemoglobin, hypokalemia with potassium of 2.9 we will give run of IV potassium as well as p.o. potassium, no other acute electrolyte derangements requiring intervention, normal renal and liver function.  Normal lipase.  Urinalysis without signs of infection.  Acute abdominal series shows no abnormal bowel gas  pattern or free air and no active cardiopulmonary disease.  After symptomatic treatment patient is feeling much better repeat abdominal exam without tenderness.  Patient has been able to keep down  ginger ale with no vomiting over 2 hours.  At this time he stable for discharge home.  Reports he still has Reglan at home will start patient on Pepcid as well.  Discussed advancing diet slowly.  PCP follow-up recommended.  Patient expresses understanding and agreement with plan.  Discharged home in good condition.  Final Clinical Impressions(s) / ED Diagnoses   Final diagnoses:  Epigastric pain  Non-intractable vomiting with nausea, unspecified vomiting type  Hypokalemia    ED Discharge Orders         Ordered    famotidine (PEPCID) 20 MG tablet  2 times daily     08/17/18 0640           Dartha Lodge, PA-C 08/17/18 0744    Dartha Lodge, PA-C 08/17/18 [id number]    Zadie Rhine, MD 08/18/18 0005

## 2018-08-22 ENCOUNTER — Emergency Department (HOSPITAL_COMMUNITY)
Admission: EM | Admit: 2018-08-22 | Discharge: 2018-08-22 | Disposition: A | Discharge status: Home / Self Care | Payer: BLUE CROSS/BLUE SHIELD | Attending: Emergency Medicine | Admitting: Emergency Medicine

## 2018-08-22 ENCOUNTER — Emergency Department (HOSPITAL_COMMUNITY): Payer: BLUE CROSS/BLUE SHIELD

## 2018-08-22 ENCOUNTER — Other Ambulatory Visit: Payer: Self-pay

## 2018-08-22 ENCOUNTER — Encounter (HOSPITAL_COMMUNITY): Payer: Self-pay | Admitting: *Deleted

## 2018-08-22 DIAGNOSIS — R1010 Upper abdominal pain, unspecified: Secondary | ICD-10-CM | POA: Diagnosis not present

## 2018-08-22 DIAGNOSIS — R1011 Right upper quadrant pain: Secondary | ICD-10-CM

## 2018-08-22 DIAGNOSIS — Z79899 Other long term (current) drug therapy: Secondary | ICD-10-CM | POA: Insufficient documentation

## 2018-08-22 DIAGNOSIS — R112 Nausea with vomiting, unspecified: Secondary | ICD-10-CM | POA: Diagnosis present

## 2018-08-22 DIAGNOSIS — Z21 Asymptomatic human immunodeficiency virus [HIV] infection status: Secondary | ICD-10-CM | POA: Insufficient documentation

## 2018-08-22 DIAGNOSIS — R1012 Left upper quadrant pain: Secondary | ICD-10-CM

## 2018-08-22 LAB — COMPREHENSIVE METABOLIC PANEL
ALT: 18 U/L (ref 0–44)
AST: 24 U/L (ref 15–41)
Albumin: 4.7 g/dL (ref 3.5–5.0)
Alkaline Phosphatase: 56 U/L (ref 38–126)
Anion gap: 11 (ref 5–15)
BUN: 9 mg/dL (ref 6–20)
CO2: 29 mmol/L (ref 22–32)
Calcium: 10.1 mg/dL (ref 8.9–10.3)
Chloride: 97 mmol/L — ABNORMAL LOW (ref 98–111)
Creatinine, Ser: 1.21 mg/dL (ref 0.61–1.24)
GFR calc Af Amer: >60 mL/min (ref >60)
GFR calc non Af Amer: >60 mL/min (ref >60)
Glucose, Bld: 104 mg/dL — ABNORMAL HIGH (ref 70–99)
POTASSIUM: 3.1 mmol/L — AB (ref 3.5–5.1)
Sodium: 137 mmol/L (ref 135–145)
Total Bilirubin: 2.3 mg/dL — ABNORMAL HIGH (ref 0.3–1.2)
Total Protein: 7.8 g/dL (ref 6.5–8.1)

## 2018-08-22 LAB — CBC WITH DIFFERENTIAL/PLATELET
ABS IMMATURE GRANULOCYTES: 0.01 10*3/uL (ref 0.00–0.07)
BASOS PCT: 0 %
Basophils Absolute: 0 10*3/uL (ref 0.0–0.1)
Eosinophils Absolute: 0 10*3/uL (ref 0.0–0.5)
Eosinophils Relative: 1 %
HCT: 47.4 % (ref 39.0–52.0)
Hemoglobin: 15.7 g/dL (ref 13.0–17.0)
Immature Granulocytes: 0 %
Lymphocytes Relative: 57 %
Lymphs Abs: 2.9 10*3/uL (ref 0.7–4.0)
MCH: 25.7 pg — ABNORMAL LOW (ref 26.0–34.0)
MCHC: 33.1 g/dL (ref 30.0–36.0)
MCV: 77.6 fL — ABNORMAL LOW (ref 80.0–100.0)
Monocytes Absolute: 0.5 10*3/uL (ref 0.1–1.0)
Monocytes Relative: 10 %
NEUTROS ABS: 1.6 10*3/uL — AB (ref 1.7–7.7)
Neutrophils Relative %: 32 %
Platelets: 222 10*3/uL (ref 150–400)
RBC: 6.11 MIL/uL — ABNORMAL HIGH (ref 4.22–5.81)
RDW: 12.5 % (ref 11.5–15.5)
WBC: 5.1 10*3/uL (ref 4.0–10.5)
nRBC: 0 % (ref 0.0–0.2)

## 2018-08-22 LAB — URINALYSIS, ROUTINE W REFLEX MICROSCOPIC
Glucose, UA: NEGATIVE mg/dL
Hgb urine dipstick: NEGATIVE
Ketones, ur: 20 mg/dL — AB
Leukocytes,Ua: NEGATIVE
Nitrite: NEGATIVE
Protein, ur: 100 mg/dL — AB
Specific Gravity, Urine: 1.032 — ABNORMAL HIGH (ref 1.005–1.030)
pH: 7 (ref 5.0–8.0)

## 2018-08-22 LAB — LIPASE, BLOOD: Lipase: 18 U/L (ref 11–51)

## 2018-08-22 MED ORDER — SODIUM CHLORIDE 0.9 % IV BOLUS
1000.0000 mL | Freq: Once | INTRAVENOUS | Status: AC
  Administered 2018-08-22: 1000 mL via INTRAVENOUS

## 2018-08-22 MED ORDER — POTASSIUM CHLORIDE CRYS ER 20 MEQ PO TBCR
40.0000 meq | EXTENDED_RELEASE_TABLET | Freq: Once | ORAL | Status: AC
  Administered 2018-08-22: 40 meq via ORAL
  Filled 2018-08-22: qty 2

## 2018-08-22 MED ORDER — PROMETHAZINE HCL 25 MG/ML IJ SOLN
12.5000 mg | Freq: Once | INTRAMUSCULAR | Status: AC
  Administered 2018-08-22: 12.5 mg via INTRAVENOUS
  Filled 2018-08-22: qty 1

## 2018-08-22 MED ORDER — CAPSAICIN 0.025 % EX CREA
TOPICAL_CREAM | Freq: Once | CUTANEOUS | Status: AC
  Administered 2018-08-22: 12:00:00 via TOPICAL
  Filled 2018-08-22: qty 60

## 2018-08-22 MED ORDER — POTASSIUM CHLORIDE CRYS ER 20 MEQ PO TBCR: 20.0000 meq | EXTENDED_RELEASE_TABLET | Freq: Every day | ORAL | 0 refills | Status: DC

## 2018-08-22 MED ORDER — PROMETHAZINE HCL 25 MG RE SUPP: 25.0000 mg | Freq: Four times a day (QID) | RECTAL | 0 refills | Status: DC | PRN

## 2018-08-22 NOTE — ED Triage Notes (Signed)
PT returned from US.

## 2018-08-22 NOTE — ED Notes (Signed)
Pt was asked what he wanted to eat for PO challenge, Pt refused any food offered.

## 2018-08-22 NOTE — ED Triage Notes (Signed)
PT has urinal for sample. Pt voiced understanding that urine sample is needed

## 2018-08-22 NOTE — Discharge Instructions (Addendum)
Use Phenergan suppositories as needed for nausea and vomiting control. Use capsaicin cream to help with abdominal pain. Take potassium as prescribed. Follow-up with your GI doctor if your symptoms persist. Return to the emergency room with any new, worsening, concerning symptoms.

## 2018-08-22 NOTE — ED Notes (Signed)
Pt returned from US

## 2018-08-22 NOTE — ED Provider Notes (Signed)
  Physical Exam  BP (!) 120/92 (BP Location: Right Arm)   Pulse 91   Temp 98.4 F (36.9 C) (Oral)   Resp 18   Ht 5\' 9"  (1.753 m)   Wt 56.7 kg   SpO2 100%   BMI 18.46 kg/m   Physical Exam  ED Course/Procedures     Procedures  MDM  Matthew Petersen is a 18 y.o. male with recent diagnosis of HIV on meds, here presenting with vomiting.  Patient was recently seen in the ED had a CT abdomen pelvis that was unremarkable.  Patient was noted to have a bilirubin of 2.1 but LFTs are normal.  Signout pending right upper quadrant ultrasound.  4:30 PM RUQ US unremarkable. Felt better after IVF. Will dc home with potassium, phenergan. Will have him follow up with infectious disease for his HIV. Given that his CD4 was 760 a month ago and had unremarkable CT ab/pel a week ago, I doubt opportunistic infection and he is stable for discharge         Charlynne Pander, MD 08/22/18 1630

## 2018-08-22 NOTE — ED Provider Notes (Signed)
[facility] EMERGENCY DEPARTMENT Provider Note   CSN: [id number] Arrival date & time: 08/22/18  1039    History   Chief Complaint Chief Complaint  Patient presents with  . Emesis    HPI Matthew Petersen is a 18 y.o. male presenting for evaluation nausea, vomiting, abdominal pain.  Patient states the past 2 weeks, he has been having persistent nausea and vomiting.  Patient reports intermittent upper abdominal pain, worse on the left side.  Patient states he has been seen here for the same in the past, he has been trying to take Reglan and famotidine as prescribed, but unable to tolerate the p.o. medications.  Patient reports associated weakness due to poor p.o. intake.  He denies fevers, chills, chest pain, shortness of breath, cough.  Patient reports his urine is dark, but denies dysuria, hematuria, urinary frequency.  Patient states he had a bowel movement for the first time yesterday in 2 weeks.  Bowel movement was slightly darker than normal, but otherwise normal.  Patient has been unable to tolerate p.o. for 2 weeks.  Of note, patient was started on antiviral medication for HIV 2 weeks ago when symptoms began.  Additionally, has a history of cyclic vomiting, likely due to marijuana use.  Patient reports symptoms improve with a hot shower or warm bath.  Nothing else has made them better.  He does not take anything for pain including Tylenol ibuprofen.  Prior to several months ago when he had something similar, he had never had any GI issues.  He denies history of abdominal surgeries.     HPI  Past Medical History:  Diagnosis Date  . HIV (human immunodeficiency virus infection) [facility])     Patient Active Problem List   Diagnosis Date Noted  . Marijuana use 08/10/2018  . HIV disease (HCC) 07/21/2018  . Erectile dysfunction 07/19/2018  . Intractable vomiting 02/22/2018  . Mood disorder of depressed type 12/29/2017  . Loss of weight 12/29/2017  . Exposure to STD  12/22/2017    History reviewed. No pertinent surgical history.      Home Medications    Prior to Admission medications   Medication Sig Start Date End Date Taking? Authorizing Provider  bictegravir-emtricitabine-tenofovir AF (BIKTARVY) 50-200-25 MG TABS tablet Take 1 tablet by mouth daily. 08/10/18   Veryl Speak, FNP  famotidine (PEPCID) 20 MG tablet Take 1 tablet (20 mg total) by mouth 2 (two) times daily. 08/17/18   Dartha Lodge, PA-C  metoCLOPramide (REGLAN) 10 MG tablet Take 1 tablet (10 mg total) by mouth every 6 (six) hours. 08/13/18   Elson Areas, PA-C    Family History Family History  Problem Relation Age of Onset  . Diabetes Mother   . Multiple sclerosis Father     Social History Social History   Tobacco Use  . Smoking status: Never Smoker  . Smokeless tobacco: Never Used  Substance Use Topics  . Alcohol use: Not Currently  . Drug use: Yes    Frequency: 7.0 times per week    Types: Marijuana     Allergies   Patient has no known allergies.   Review of Systems Review of Systems  Gastrointestinal: Positive for abdominal pain, nausea and vomiting.  Neurological: Positive for weakness.  All other systems reviewed and are negative.    Physical Exam Updated Vital Signs BP (!) 129/95 (BP Location: Right Arm)   Pulse (!) 121   Temp 98.4 F (36.9 C) (Oral)   Resp 20  Ht  (1.753 m)   Wt 56.7 kg   SpO2 100%   BMI 18.46 kg/m   Physical Exam Vitals signs and nursing note reviewed.  Constitutional:      General: He is not in acute distress.    Appearance: He is well-developed.     Comments: Appears nontoxic, MM dry  HENT:     Head: Normocephalic and atraumatic.     Mouth/Throat:     Mouth: Mucous membranes are dry.  Eyes:     Conjunctiva/sclera: Conjunctivae normal.     Pupils: Pupils are equal, round, and reactive to light.  Neck:     Musculoskeletal: Normal range of motion and neck supple.  Cardiovascular:     Rate and Rhythm:  Regular rhythm. Tachycardia present.     Pulses: Normal pulses.     Comments: Tachycardic around 120 Pulmonary:     Effort: Pulmonary effort is normal. No respiratory distress.     Breath sounds: Normal breath sounds. No wheezing.  Abdominal:     General: There is no distension.     Palpations: Abdomen is soft. There is no mass.     Tenderness: There is abdominal tenderness. There is no guarding or rebound.     Comments: Tenderness palpation of upper abdomen.  No rigidity, guarding, distention.  Negative rebound.  No peritonitis.  No flank pain.  Musculoskeletal: Normal range of motion.  Skin:    General: Skin is warm and dry.     Capillary Refill: Capillary refill takes less than 2 seconds.  Neurological:     Mental Status: He is alert and oriented to person, place, and time.      ED Treatments / Results  Labs (all labs ordered are listed, but only abnormal results are displayed) Labs Reviewed  CBC WITH DIFFERENTIAL/PLATELET - Abnormal; Notable for the following components:      Result Value   RBC 6.11 (*)    MCV 77.6 (*)    MCH 25.7 (*)    Neutro Abs 1.6 (*)    All other components within normal limits  COMPREHENSIVE METABOLIC PANEL - Abnormal; Notable for the following components:   Potassium 3.1 (*)    Chloride 97 (*)    Glucose, Bld 104 (*)    Total Bilirubin 2.3 (*)    All other components within normal limits  LIPASE, BLOOD  URINALYSIS, ROUTINE W REFLEX MICROSCOPIC    EKG None  Radiology No results found.  Procedures Procedures (including critical care time)  Medications Ordered in ED Medications  sodium chloride 0.9 % bolus 1,000 mL (1,000 mLs Intravenous New Bag/Given 08/22/18 1155)  capsaicin (ZOSTRIX) 0.025 % cream ( Topical Given 08/22/18 1218)  promethazine (PHENERGAN) injection 12.5 mg (12.5 mg Intravenous Given 08/22/18 1151)     Initial Impression / Assessment and Plan / ED Course  I have reviewed the triage vital signs and the nursing  notes.  Pertinent labs & imaging results that were available during my care of the patient were reviewed by me and considered in my medical decision making (see chart for details).        Patient presenting for evaluation nausea, vomiting, abdominal pain.  History of similar.  Symptoms been going on for 2 weeks.  On exam, patient with upper abdominal tenderness.  MM dry, and heart rate elevated.  Likely dehydrated.  Per chart review, patient has history of hypokalemia.  As such, will obtain labs for further evaluation.  Consider cyclic vomiting due to marijuana use.  Also consider new HIV medication as cause.  Will give fluids, capsaicin, Phenergan, and reassess.  Labs with mild hypokalemia, but not significantly worse from previous.  Creatinine stable.  Lipase negative.  No leukocytosis.  As such, doubt intra-abdominal infection.  However, bilirubin elevated at 2.3.  As such, will obtain ultrasound to assess for possible gallstone.  On reassessment, patient reports nausea and pain has resolved with capsaicin and Phenergan.  As such, consider increased likelihood of hyperemesis due to cannabinoid use.  Pt signed out to D. Silverio Lay, MD for f/u on [place]. If negative, plan for PO challenge and d/c with capsacin (+/- phenergan suppository).   Final Clinical Impressions(s) / ED Diagnoses   Final diagnoses:  Nausea & vomiting    ED Discharge Orders    None       Alveria Apley, PA-C 08/22/18 1447    Charlynne Pander, MD 08/22/18 [phone]

## 2018-08-22 NOTE — ED Triage Notes (Signed)
Pt has been vomiting for 2 weeks . Pt reports meds he was given have not stopped the vomiting.

## 2018-08-22 NOTE — ED Notes (Signed)
Patient verbalizes understanding of discharge instructions . Opportunity for questions and answers were provided . Armband removed by staff ,Pt discharged from ED. W/C  offered at D/C  and Declined W/C at D/C and was escorted to lobby by RN.  

## 2018-09-08 ENCOUNTER — Encounter: Payer: Self-pay | Admitting: Family

## 2018-09-08 ENCOUNTER — Ambulatory Visit (INDEPENDENT_AMBULATORY_CARE_PROVIDER_SITE_OTHER): Payer: BLUE CROSS/BLUE SHIELD | Admitting: Family

## 2018-09-08 ENCOUNTER — Other Ambulatory Visit: Payer: Self-pay

## 2018-09-08 VITALS — BP 94/61 | HR 87 | Temp 98.1°F | Wt 126.0 lb

## 2018-09-08 DIAGNOSIS — F12188 Cannabis abuse with other cannabis-induced disorder: Secondary | ICD-10-CM | POA: Diagnosis not present

## 2018-09-08 DIAGNOSIS — B20 Human immunodeficiency virus [HIV] disease: Secondary | ICD-10-CM

## 2018-09-08 DIAGNOSIS — Z113 Encounter for screening for infections with a predominantly sexual mode of transmission: Secondary | ICD-10-CM | POA: Insufficient documentation

## 2018-09-08 DIAGNOSIS — Z0000 Encounter for general adult medical examination without abnormal findings: Secondary | ICD-10-CM | POA: Diagnosis not present

## 2018-09-08 MED ORDER — BICTEGRAVIR-EMTRICITAB-TENOFOV 50-200-25 MG PO TABS: 1.0000 | ORAL_TABLET | Freq: Every day | ORAL | 2 refills | Status: DC

## 2018-09-08 NOTE — Patient Instructions (Signed)
Nice to see you.  Please continue to take your [place] daily.  We will check your blood work today and get you the results via MyChart.   Plan for office visit in 2 months or sooner if needed.

## 2018-09-08 NOTE — Progress Notes (Signed)
Subjective:    Patient ID: Matthew Petersen, male    DOB: March 10, 2001, 18 y.o.   MRN: [id number]  Chief Complaint  Patient presents with  . HIV Positive/AIDS     HPI:  Matthew Petersen is a 18 y.o. male who presents today for follow up of HIV disease.   Matthew Petersen was last seen in the office on 08/10/18 to establish care for newly diagnosed HIV disease. He was started on Biktarvy at the time for a CD4 count of 760 and a viral load of 139,000.   Matthew Petersen has been taking his Biktarvy as prescribed with the exception of 6 missed doses when he was experiencing marijuana hyperemesis syndrome and was not able to keep anything down. He has since restarted taking his Biktarvy and has no adverse side effects. Feeling better today, although still feels a little weak and fatigued.  Denies fevers, chills, night sweats, headaches, changes in vision, neck pain/stiffness, nausea, diarrhea, vomiting, lesions or rashes.  Matthew Petersen is receiving his medication from [facility] pharmacy with no problems. He is living at home with his mother at present. Not currently sexually active. Last dental visit was within the last 6 months.   No Known Allergies    Outpatient Medications Prior to Visit  Medication Sig Dispense Refill  . bictegravir-emtricitabine-tenofovir AF (BIKTARVY) 50-200-25 MG TABS tablet Take 1 tablet by mouth daily. 30 tablet 2  . famotidine (PEPCID) 20 MG tablet Take 1 tablet (20 mg total) by mouth 2 (two) times daily. (Patient not taking: Reported on 09/08/2018) 30 tablet 0  . metoCLOPramide (REGLAN) 10 MG tablet Take 1 tablet (10 mg total) by mouth every 6 (six) hours. (Patient not taking: Reported on 09/08/2018) 30 tablet 0  . potassium chloride SA (K-DUR,KLOR-CON) 20 MEQ tablet Take 1 tablet (20 mEq total) by mouth daily for 7 days. 7 tablet 0  . promethazine (PHENERGAN) 25 MG suppository Place 1 suppository (25 mg total) rectally every 6 (six) hours as needed for nausea or  vomiting. (Patient not taking: Reported on 09/08/2018) 12 each 0   No facility-administered medications prior to visit.      Past Medical History:  Diagnosis Date  . HIV (human immunodeficiency virus infection) (HCC)      History reviewed. No pertinent surgical history.     Review of Systems  Constitutional: Negative for appetite change, chills, fatigue, fever and unexpected weight change.  Eyes: Negative for visual disturbance.  Respiratory: Negative for cough, chest tightness, shortness of breath and wheezing.   Cardiovascular: Negative for chest pain and leg swelling.  Gastrointestinal: Negative for abdominal pain, constipation, diarrhea, nausea and vomiting.  Genitourinary: Negative for dysuria, flank pain, frequency, genital sores, hematuria and urgency.  Skin: Negative for rash.  Allergic/Immunologic: Negative for immunocompromised state.  Neurological: Negative for dizziness and headaches.      Objective:    BP 94/61   Pulse 87   Temp 98.1 F (36.7 C) (Oral)   Wt 126 lb (57.2 kg)   BMI 18.61 kg/m  Nursing note and vital signs reviewed.  Physical Exam Constitutional:      General: He is not in acute distress.    Appearance: He is well-developed.  Eyes:     Conjunctiva/sclera: Conjunctivae normal.  Neck:     Musculoskeletal: Neck supple.  Cardiovascular:     Rate and Rhythm: Normal rate and regular rhythm.     Heart sounds: Normal heart sounds. No murmur. No friction rub. No gallop.  Pulmonary:     Effort: Pulmonary effort is normal. No respiratory distress.     Breath sounds: Normal breath sounds. No wheezing or rales.  Chest:     Chest wall: No tenderness.  Abdominal:     General: Bowel sounds are normal.     Palpations: Abdomen is soft.     Tenderness: There is no abdominal tenderness.  Lymphadenopathy:     Cervical: No cervical adenopathy.  Skin:    General: Skin is warm and dry.     Findings: No rash.  Neurological:     Mental Status: He is  alert and oriented to person, place, and time.  Psychiatric:        Behavior: Behavior normal.        Thought Content: Thought content normal.        Judgment: Judgment normal.        Assessment & Plan:   Problem List Items Addressed This Visit      Digestive   Cannabis hyperemesis syndrome concurrent with and due to cannabis abuse (HCC)    Symptoms appear resolved with residual fatigue that is continuing to improve. Encouraged to discontinue marijuana usage. Working on improving nutrition and intake. Will check CMP today.         Other   HIV disease (HCC) - Primary    Matthew Petersen appears to be doing well with his ART regimen of Biktarvy with no adverse side effects. Adherence complicated by recent episode of hyperemesis which has improved. No signs/symptoms of opportunistic infection at present. Will check viral load today. Discussed importance of taking medication as prescribed. Plan for follow up in 2 months or sooner if needed with lab work same day.       Relevant Medications   bictegravir-emtricitabine-tenofovir AF (BIKTARVY) 50-200-25 MG TABS tablet   Other Relevant Orders   HIV-1 RNA quant-no reflex-bld   COMPLETE METABOLIC PANEL WITH GFR   Healthcare maintenance     Declines immunizations today. Discussed importance of safe sexual practice to reduce risk of acquisition and transmission of STI.  Most recent dental visit within the last 6 months.            I am having Matthew Petersen maintain his metoCLOPramide, famotidine, potassium chloride SA, promethazine, and bictegravir-emtricitabine-tenofovir AF.   Meds ordered this encounter  Medications  . bictegravir-emtricitabine-tenofovir AF (BIKTARVY) 50-200-25 MG TABS tablet    Sig: Take 1 tablet by mouth daily.    Dispense:  30 tablet    Refill:  2    Order Specific Question:   Supervising Provider    Answer:   Judyann Munson [[id number]]     Follow-up: Return in about 2 months (around 11/08/2018), or if  symptoms worsen or fail to improve.   Marcos Eke, MSN, FNP-C Nurse Practitioner [facility] for Infectious Disease [facility] Main number: [phone]

## 2018-09-08 NOTE — Assessment & Plan Note (Signed)
Matthew Petersen appears to be doing well with his ART regimen of Biktarvy with no adverse side effects. Adherence complicated by recent episode of hyperemesis which has improved. No signs/symptoms of opportunistic infection at present. Will check viral load today. Discussed importance of taking medication as prescribed. Plan for follow up in 2 months or sooner if needed with lab work same day.

## 2018-09-08 NOTE — Assessment & Plan Note (Addendum)
   Declines immunizations today. Discussed importance of safe sexual practice to reduce risk of acquisition and transmission of STI.  Most recent dental visit within the last 6 months.

## 2018-09-08 NOTE — Assessment & Plan Note (Signed)
Symptoms appear resolved with residual fatigue that is continuing to improve. Encouraged to discontinue marijuana usage. Working on improving nutrition and intake. Will check CMP today.

## 2018-09-13 LAB — COMPLETE METABOLIC PANEL WITH GFR
AG Ratio: 2.2 (calc) (ref 1.0–2.5)
ALKALINE PHOSPHATASE (APISO): 43 U/L — AB (ref 46–169)
ALT: 10 U/L (ref 8–46)
AST: 14 U/L (ref 12–32)
Albumin: 4.2 g/dL (ref 3.6–5.1)
BUN: 9 mg/dL (ref 7–20)
CO2: 29 mmol/L (ref 20–32)
Calcium: 8.9 mg/dL (ref 8.9–10.4)
Chloride: 106 mmol/L (ref 98–110)
Creat: 0.93 mg/dL (ref 0.60–1.26)
GFR, Est African American: 137 mL/min/{1.73_m2} (ref >=60)
GFR, Est Non African American: 119 mL/min/{1.73_m2} (ref >=60)
Globulin: 1.9 g/dL (calc) — ABNORMAL LOW (ref 2.1–3.5)
Glucose, Bld: 97 mg/dL (ref 65–99)
Potassium: 4 mmol/L (ref 3.8–5.1)
Sodium: 141 mmol/L (ref 135–146)
Total Bilirubin: 0.6 mg/dL (ref 0.2–1.1)
Total Protein: 6.1 g/dL — ABNORMAL LOW (ref 6.3–8.2)

## 2018-09-13 LAB — HIV-1 RNA QUANT-NO REFLEX-BLD
HIV 1 RNA Quant: 140 copies/mL — ABNORMAL HIGH
HIV-1 RNA Quant, Log: 2.15 Log copies/mL — ABNORMAL HIGH

## 2018-09-13 MED FILL — BIKTARVY 50-200-25 MG TABS: 50-200-25 | 30 days supply | Qty: 30 | Fill #1

## 2018-10-17 MED FILL — BIKTARVY 50-200-25 MG TABS: 50-200-25 | 30 days supply | Qty: 30 | Fill #2

## 2018-11-14 ENCOUNTER — Telehealth: Payer: Self-pay | Admitting: *Deleted

## 2018-11-14 NOTE — Telephone Encounter (Signed)
Patient called to advise he has a rash on his arms and legs for about 2-3 days. He advised it is patchy and itches bad. He denies any shortness of breath or swelling of face and tongue. Made him an appointment to be seen 11/15/18 at 845 and advised him to not take his medication tonight.

## 2018-11-15 ENCOUNTER — Ambulatory Visit (INDEPENDENT_AMBULATORY_CARE_PROVIDER_SITE_OTHER): Payer: BLUE CROSS/BLUE SHIELD | Admitting: Infectious Diseases

## 2018-11-15 ENCOUNTER — Encounter: Payer: Self-pay | Admitting: Infectious Diseases

## 2018-11-15 ENCOUNTER — Other Ambulatory Visit: Payer: Self-pay

## 2018-11-15 VITALS — BP 101/72 | HR 74 | Temp 98.0°F | Wt 146.0 lb

## 2018-11-15 DIAGNOSIS — L247 Irritant contact dermatitis due to plants, except food: Secondary | ICD-10-CM

## 2018-11-15 MED ORDER — PREDNISONE 10 MG (21) PO TBPK: ORAL_TABLET | ORAL | 0 refills | Status: DC

## 2018-11-15 MED ORDER — BETAMETHASONE DIPROPIONATE 0.05 % EX CREA: TOPICAL_CREAM | Freq: Two times a day (BID) | CUTANEOUS | 0 refills | Status: DC

## 2018-11-15 MED ORDER — HYDROXYZINE HCL 25 MG PO TABS: 25.0000 mg | ORAL_TABLET | Freq: Three times a day (TID) | ORAL | 0 refills | Status: DC | PRN

## 2018-11-15 NOTE — Patient Instructions (Signed)
I think your rash is likely due to something you came into contact with in the yard.  Alternatively this could be a condition called dyshidrotic eczema.  We will treat these the same way with anti-inflammatory medication and antihistamines to help with the itch.  Please see details below in regards to things you can do at home to help relieve the itch.  Calamine lotion, colloidal oatmeal baths are very helpful to use in between the dosing of your steroid cream.  Please keep your nails trimmed and clean to prevent infection of the sites.  Do not do extremely hot showers or baths as this can dry the skin out make you itch more.  Stick to a tepid or room temperature bath towel off by patting dry and immediately apply lotion to prevent drying of the skin.  The hydroxyzine you can take up to 3 times a day for itching.  The prednisone directions are detailed below and the steroid cream I sent in can be used twice a day topically to the areas that itch.  Please try to use this cream consistently for the next week or 2 in hopes that these will improve.  For your prednisone:  On day 1 and 2 take 4 pills in the morning with food  On day 3 and 4 take 3 pills in the morning with food  On day 5 and 6 take 2 pills in the morning with food  On day 7-8-9 take 1 pill in the morning with food and stop    Poison Oak Dermatitis  Poison oak dermatitis is redness and soreness (inflammation) of the skin. It is caused by a chemical that is on the leaves of the poison oak plant. You may also have itching, a rash, and blisters. Symptoms often clear up in 1-2 weeks. You may get this condition by touching a poison oak plant. You can also get it by touching something that has the chemical on it. This may include animals or objects that have come in contact with the plant. Follow these instructions at home: General instructions  Take or apply over-the-counter and prescription medicines only as told by your  doctor.  If you touch poison oak, wash your skin with soap and cold water right away.  Use hydrocortisone creams or calamine lotion as needed to help with itching.  Take oatmeal baths as needed. Use colloidal oatmeal. You can get this at a pharmacy or grocery store. Follow the instructions on the package.  Do not scratch or rub your skin.  While you have the rash, wash your clothes right after you wear them. Prevention  Know what poison oak looks like so you can avoid it. This plant has three leaves with flowering branches on a single stem. The leaves are fuzzy. They have a toothlike edge.  If you have touched poison oak, wash with soap and water right away. Be sure to wash under your fingernails.  When hiking or camping, wear long pants, a long-sleeved shirt, tall socks, and hiking boots. You can also use a lotion on your skin that helps to prevent contact with the chemical on the plant.  If you think that your clothes or outdoor gear came in contact with poison oak, rinse them off with a garden hose before you bring them inside your house. Contact a doctor if:  You have open sores in the rash area.  You have more redness, swelling, or pain in the affected area.  You have redness that spreads  beyond the rash area.  You have fluid, blood, or pus coming from the affected area.  You have a fever.  You have a rash over a large area of your body.  You have a rash on your eyes, mouth, or genitals.  Your rash does not improve after a few days. Get help right away if:  Your face swells or your eyes swell shut.  You have trouble breathing.  You have trouble swallowing. This information is not intended to replace advice given to you by your health care provider. Make sure you discuss any questions you have with your health care provider. Document Released: 07/04/2010 Document Revised: 02/23/2018 Document Reviewed: 11/07/2014 Elsevier Interactive Patient Education  2019 [employer].

## 2018-11-15 NOTE — Progress Notes (Signed)
Name: Matthew Petersen  DOB: 31-Dec-2000 MRN: [id number] PCP: Carney Living, MD    Patient Active Problem List   Diagnosis Date Noted  . Contact dermatitis and eczema due to plant 11/17/2018  . Cannabis hyperemesis syndrome concurrent with and due to cannabis abuse (HCC) 09/08/2018  . Healthcare maintenance 09/08/2018  . Marijuana use 08/10/2018  . HIV disease (HCC) 07/21/2018  . Erectile dysfunction 07/19/2018  . Intractable vomiting 02/22/2018  . Mood disorder of depressed type 12/29/2017  . Loss of weight 12/29/2017  . Exposure to STD 12/22/2017     Subjective:   Chief Complaint  Patient presents with  . Rash     HPI: Matthew Petersen is here for evaluation of a rash.  He said he first noticed this rash about 2 to 3 days ago.  It started as small skin colored bumps in various spots on his hands and left arms up until his inner elbows.  They are now little clusters of skin colored bumps in 1-3, they itch intensely and no over-the-counter remedies seems to have helped.  He is tried over-the-counter hydrocortisone cream.  No new soaps, detergents, lotions, skin or bath products.  His partner who is the only other person in his household does not have anything similar.  He tells me that he recently was cutting the lawn and trimming some shrubs in the backyard.  He had shorts and a T-shirt on during this task.  He has never had trouble with eczema or skin allergies in the past.  He does have some her seasonal allergies but they have not picked up for him yet.  Review of Systems  All other systems reviewed and are negative.   Past Medical History:  Diagnosis Date  . HIV (human immunodeficiency virus infection) (HCC)     Outpatient Medications Prior to Visit  Medication Sig Dispense Refill  . bictegravir-emtricitabine-tenofovir AF (BIKTARVY) 50-200-25 MG TABS tablet Take 1 tablet by mouth daily. 30 tablet 2  . famotidine (PEPCID) 20 MG tablet Take 1 tablet (20 mg total) by mouth  2 (two) times daily. (Patient not taking: Reported on 09/08/2018) 30 tablet 0  . metoCLOPramide (REGLAN) 10 MG tablet Take 1 tablet (10 mg total) by mouth every 6 (six) hours. (Patient not taking: Reported on 09/08/2018) 30 tablet 0  . potassium chloride SA (K-DUR,KLOR-CON) 20 MEQ tablet Take 1 tablet (20 mEq total) by mouth daily for 7 days. 7 tablet 0  . promethazine (PHENERGAN) 25 MG suppository Place 1 suppository (25 mg total) rectally every 6 (six) hours as needed for nausea or vomiting. (Patient not taking: Reported on 09/08/2018) 12 each 0   No facility-administered medications prior to visit.      No Known Allergies  Social History   Tobacco Use  . Smoking status: Never Smoker  . Smokeless tobacco: Never Used  Substance Use Topics  . Alcohol use: Not Currently  . Drug use: Yes    Frequency: 7.0 times per week    Types: Marijuana    Family History  Problem Relation Age of Onset  . Diabetes Mother   . Multiple sclerosis Father     Social History   Substance and Sexual Activity  Sexual Activity Not on file     Objective:   Vitals:   11/15/18 0845  BP: 101/72  Pulse: 74  Temp: 98 F (36.7 C)  TempSrc: Oral  Weight: 146 lb (66.2 kg)   Body mass index is 21.56 kg/m.  Physical Exam Constitutional:  Appearance: Normal appearance. He is not ill-appearing.  Cardiovascular:     Rate and Rhythm: Normal rate and regular rhythm.  Pulmonary:     Effort: Pulmonary effort is normal. No respiratory distress.  Musculoskeletal: Normal range of motion.  Skin:    General: Skin is warm and dry.     Comments: Bilateral hands with several scattered clusters of 1-3 small vesicles/papules around knuckles of hands.  No rashes noted in the interdigital webbing. Bilateral arms have areas that are inflamed and more linear with some vesicles, however most of these areas have been opened with scratching.  No signs of any purulence or secondary bacterial infection involved.   Neurological:     Mental Status: He is alert.            Lab Results Lab Results  Component Value Date   WBC 5.1 08/22/2018   HGB 15.7 08/22/2018   HCT 47.4 08/22/2018   MCV 77.6 (L) 08/22/2018   PLT 222 08/22/2018    Lab Results  Component Value Date   CREATININE 0.93 09/08/2018   BUN 9 09/08/2018   NA 141 09/08/2018   K 4.0 09/08/2018   CL 106 09/08/2018   CO2 29 09/08/2018    Lab Results  Component Value Date   ALT 10 09/08/2018   AST 14 09/08/2018   ALKPHOS 56 08/22/2018   BILITOT 0.6 09/08/2018    Lab Results  Component Value Date   CHOL 182 (H) 07/25/2018   HDL 48 07/25/2018   LDLCALC 118 (H) 07/25/2018   TRIG 69 07/25/2018   CHOLHDL 3.8 07/25/2018   HIV 1 RNA Quant (copies/mL)  Date Value  09/08/2018 140 (H)  07/25/2018 139,000 (H)   CD4 T Cell Abs (/uL)  Date Value  07/25/2018 760     Assessment & Plan:   Problem List Items Addressed This Visit      Unprioritized   Contact dermatitis and eczema due to plant - Primary    His rash is most consistent with contact dermatitis likely due to something he encountered doing yard work.  Will prescribe a stronger topical steroid and considering multiple sites involved 5 days of burst steroid orally.  Hydroxyzine 25 mg 3 times daily as needed itching.  We also discussed supportive care management to these lesions to help prevent secondary infection or spreading.         Rexene AlbertsStephanie Cataleia Gade, MSN, NP-C [facility] Pager: [phone] Office: [phone]  11/17/18  11:08 AM

## 2018-11-17 DIAGNOSIS — L247 Irritant contact dermatitis due to plants, except food: Secondary | ICD-10-CM | POA: Insufficient documentation

## 2018-11-17 NOTE — Assessment & Plan Note (Signed)
His rash is most consistent with contact dermatitis likely due to something he encountered doing yard work.  Will prescribe a stronger topical steroid and considering multiple sites involved 5 days of burst steroid orally.  Hydroxyzine 25 mg 3 times daily as needed itching.  We also discussed supportive care management to these lesions to help prevent secondary infection or spreading.

## 2018-11-21 ENCOUNTER — Ambulatory Visit: Payer: BLUE CROSS/BLUE SHIELD | Admitting: Family

## 2018-11-21 MED FILL — BIKTARVY 50-200-25 MG TABS: 50-200-25 | 30 days supply | Qty: 30 | Fill #0

## 2018-11-22 ENCOUNTER — Other Ambulatory Visit: Payer: Self-pay

## 2018-11-22 ENCOUNTER — Ambulatory Visit (INDEPENDENT_AMBULATORY_CARE_PROVIDER_SITE_OTHER): Payer: BLUE CROSS/BLUE SHIELD | Admitting: Family

## 2018-11-22 ENCOUNTER — Encounter: Payer: Self-pay | Admitting: Family

## 2018-11-22 VITALS — BP 131/79 | HR 92 | Temp 97.5°F | Wt 152.0 lb

## 2018-11-22 DIAGNOSIS — B20 Human immunodeficiency virus [HIV] disease: Secondary | ICD-10-CM

## 2018-11-22 DIAGNOSIS — Z0000 Encounter for general adult medical examination without abnormal findings: Secondary | ICD-10-CM

## 2018-11-22 DIAGNOSIS — F1290 Cannabis use, unspecified, uncomplicated: Secondary | ICD-10-CM

## 2018-11-22 MED ORDER — BICTEGRAVIR-EMTRICITAB-TENOFOV 50-200-25 MG PO TABS: 1.0000 | ORAL_TABLET | Freq: Every day | ORAL | 2 refills | Status: DC

## 2018-11-22 NOTE — Progress Notes (Signed)
Subjective:    Patient ID: Matthew Petersen, male    DOB: Feb 07, 2001, 18 y.o.   MRN: [id number]  Chief Complaint  Patient presents with  . HIV Positive/AIDS     HPI:  Matthew Petersen is a 18 y.o. male with HIV disease last seen in the office on 09/08/2018 with good adherence and tolerance to his ART regimen of Biktarvy.  Viral load had improved down to 140 with initial CD4 count of 760.  Mr. Matthew Petersen continues to take his Biktarvy as prescribed with no adverse side effects and 1-2 missed doses since his last office visit.  Overall feeling very well today. Denies fevers, chills, night sweats, headaches, changes in vision, neck pain/stiffness, nausea, diarrhea, vomiting, lesions or rashes.  Mr. Matthew Petersen [facility] to [facility] [employer] and has no problems obtaining his medication from the pharmacy.  He has no feelings of being down, depressed, or hopeless recently.  He does continue to smoke marijuana 2-3 times per week.  Denies tobacco use or alcohol consumption at present.  Not currently sexually active.    No Known Allergies    Outpatient Medications Prior to Visit  Medication Sig Dispense Refill  . hydrOXYzine (ATARAX/VISTARIL) 25 MG tablet Take 1 tablet (25 mg total) by mouth 3 (three) times daily as needed for itching. 60 tablet 0  . betamethasone dipropionate (DIPROLENE) 0.05 % cream Apply topically 2 (two) times daily. 30 g 0  . bictegravir-emtricitabine-tenofovir AF (BIKTARVY) 50-200-25 MG TABS tablet Take 1 tablet by mouth daily. 30 tablet 2  . famotidine (PEPCID) 20 MG tablet Take 1 tablet (20 mg total) by mouth 2 (two) times daily. 30 tablet 0  . metoCLOPramide (REGLAN) 10 MG tablet Take 1 tablet (10 mg total) by mouth every 6 (six) hours. 30 tablet 0  . predniSONE (STERAPRED UNI-PAK 21 TAB) 10 MG (21) TBPK tablet Take as directed 21 tablet 0  . promethazine (PHENERGAN) 25 MG suppository Place 1 suppository (25 mg total) rectally every 6 (six) hours as  needed for nausea or vomiting. 12 each 0  . potassium chloride SA (K-DUR,KLOR-CON) 20 MEQ tablet Take 1 tablet (20 mEq total) by mouth daily for 7 days. 7 tablet 0   No facility-administered medications prior to visit.      Past Medical History:  Diagnosis Date  . HIV (human immunodeficiency virus infection) (HCC)      History reviewed. No pertinent surgical history.   Review of Systems  Constitutional: Negative for appetite change, chills, fatigue, fever and unexpected weight change.  Eyes: Negative for visual disturbance.  Respiratory: Negative for cough, chest tightness, shortness of breath and wheezing.   Cardiovascular: Negative for chest pain and leg swelling.  Gastrointestinal: Negative for abdominal pain, constipation, diarrhea, nausea and vomiting.  Genitourinary: Negative for dysuria, flank pain, frequency, genital sores, hematuria and urgency.  Skin: Negative for rash.  Allergic/Immunologic: Negative for immunocompromised state.  Neurological: Negative for dizziness and headaches.      Objective:    BP 131/79   Pulse 92   Temp (!) 97.5 F (36.4 C)   Wt 152 lb (68.9 kg)   BMI 22.45 kg/m  Nursing note and vital signs reviewed.  Physical Exam Constitutional:      General: He is not in acute distress.    Appearance: He is well-developed.  Eyes:     Conjunctiva/sclera: Conjunctivae normal.  Neck:     Musculoskeletal: Neck supple.  Cardiovascular:     Rate and Rhythm: Normal  rate and regular rhythm.     Heart sounds: Normal heart sounds. No murmur. No friction rub. No gallop.   Pulmonary:     Effort: Pulmonary effort is normal. No respiratory distress.     Breath sounds: Normal breath sounds. No wheezing or rales.  Chest:     Chest wall: No tenderness.  Abdominal:     General: Bowel sounds are normal.     Palpations: Abdomen is soft.     Tenderness: There is no abdominal tenderness.  Lymphadenopathy:     Cervical: No cervical adenopathy.  Skin:     General: Skin is warm and dry.     Findings: No rash.  Neurological:     Mental Status: He is alert and oriented to person, place, and time.  Psychiatric:        Behavior: Behavior normal.        Thought Content: Thought content normal.        Judgment: Judgment normal.        Assessment & Plan:   Problem List Items Addressed This Visit      Other   HIV disease ([place]) - Primary    Mr. Matthew Petersen has well-controlled HIV disease with good adherence and tolerance to his ART regimen of Biktarvy.  No signs/symptoms of opportunistic infection or progressive HIV disease at present.  Discussed importance of continue to take medication as prescribed.  He has no problems obtaining medication from the pharmacy.  Continue current dose of Biktarvy.  Check blood work today.  Plan for follow-up in 2 months or sooner if needed with lab work 1 to 2 weeks prior to appointment.      Relevant Medications   bictegravir-emtricitabine-tenofovir AF (BIKTARVY) 50-200-25 MG TABS tablet   Other Relevant Orders   COMPLETE METABOLIC PANEL WITH GFR   HIV-1 RNA quant-no reflex-bld   T-helper cell (CD4)- ([facility] clinic only)   HIV-1 RNA quant-no reflex-bld   T-helper cell (CD4)- ([facility] clinic only)   Comprehensive metabolic panel   RPR   Marijuana use    Marijuana use has decreased since previous visit. Encouraged to continue to decrease to eventually abstain. Continue to monitor.       Healthcare maintenance     Discussed importance of safe sexual practice to reduce risk of acquisition/transmission of STI.  Declines immunizations.           I have discontinued Sava T. Ta's metoCLOPramide, famotidine, potassium chloride SA, promethazine, predniSONE, and betamethasone dipropionate. I am also having him maintain his hydrOXYzine and bictegravir-emtricitabine-tenofovir AF.   Meds ordered this encounter  Medications  . bictegravir-emtricitabine-tenofovir AF (BIKTARVY) 50-200-25 MG TABS tablet    Sig:  Take 1 tablet by mouth daily.    Dispense:  30 tablet    Refill:  2    Order Specific Question:   Supervising Provider    Answer:   Carlyle Basques [[id number]]     Follow-up: Return in about 2 months (around 01/22/2019), or if symptoms worsen or fail to improve.   Terri Piedra, MSN, FNP-C Nurse Practitioner [facility] for Infectious Disease [place] number: [phone]

## 2018-11-22 NOTE — Assessment & Plan Note (Signed)
Marijuana use has decreased since previous visit. Encouraged to continue to decrease to eventually abstain. Continue to monitor.

## 2018-11-22 NOTE — Assessment & Plan Note (Signed)
   Discussed importance of safe sexual practice to reduce risk of acquisition/transmission of STI.  Declines immunizations.

## 2018-11-22 NOTE — Patient Instructions (Signed)
Nice to see you.   Please continue to take your [place] as prescribed.  We will check your blood work today.  Refills of your medications will be sent to the pharmacy.  Plan for follow up in 2 months or sooner if needed with lab work 1-2 weeks prior to your appointment.  Have a great [birth year]!!!!

## 2018-11-22 NOTE — Assessment & Plan Note (Signed)
Matthew Petersen has well-controlled HIV disease with good adherence and tolerance to his ART regimen of Biktarvy.  No signs/symptoms of opportunistic infection or progressive HIV disease at present.  Discussed importance of continue to take medication as prescribed.  He has no problems obtaining medication from the pharmacy.  Continue current dose of Biktarvy.  Check blood work today.  Plan for follow-up in 2 months or sooner if needed with lab work 1 to 2 weeks prior to appointment.

## 2018-11-23 LAB — T-HELPER CELL (CD4) - (RCID CLINIC ONLY)
CD4 % Helper T Cell: 34 % (ref 33–65)
CD4 T Cell Abs: 961 /uL (ref 400–1790)

## 2018-12-03 LAB — HIV-1 RNA QUANT-NO REFLEX-BLD
HIV 1 RNA Quant: <20 copies/mL — AB
HIV-1 RNA Quant, Log: <1.3 Log copies/mL — AB

## 2018-12-03 LAB — COMPLETE METABOLIC PANEL WITH GFR
AG Ratio: 2.1 (calc) (ref 1.0–2.5)
ALT: 12 U/L (ref 8–46)
AST: 16 U/L (ref 12–32)
Albumin: 4.1 g/dL (ref 3.6–5.1)
Alkaline phosphatase (APISO): 53 U/L (ref 46–169)
BUN: 8 mg/dL (ref 7–20)
CO2: 29 mmol/L (ref 20–32)
Calcium: 8.8 mg/dL — ABNORMAL LOW (ref 8.9–10.4)
Chloride: 105 mmol/L (ref 98–110)
Creat: 0.97 mg/dL (ref 0.60–1.26)
GFR, Est African American: 131 mL/min/{1.73_m2} (ref >=60)
GFR, Est Non African American: 113 mL/min/{1.73_m2} (ref >=60)
Globulin: 2 g/dL (calc) — ABNORMAL LOW (ref 2.1–3.5)
Glucose, Bld: 74 mg/dL (ref 65–99)
Potassium: 4.1 mmol/L (ref 3.8–5.1)
Sodium: 140 mmol/L (ref 135–146)
Total Bilirubin: 0.7 mg/dL (ref 0.2–1.1)
Total Protein: 6.1 g/dL — ABNORMAL LOW (ref 6.3–8.2)

## 2018-12-31 MED FILL — BIKTARVY 50-200-25 MG TABS: 50-200-25 | 30 days supply | Qty: 30 | Fill #1

## 2019-01-05 ENCOUNTER — Telehealth: Payer: Self-pay | Admitting: Pharmacy Technician

## 2019-01-05 NOTE — Telephone Encounter (Signed)
[facility] Patient Advocate Encounter  Received notification from [facility] about the patient's refills of Biktarvy and it being delayed in refilling. He prefers to always pick up and never have them mailed. He picked up the following dates:  10/24/2018 11/28/2018 01/02/2019  And confirmed that [phone] is the accurate phone number for refill calls.

## 2019-01-23 ENCOUNTER — Other Ambulatory Visit: Payer: BLUE CROSS/BLUE SHIELD

## 2019-01-25 ENCOUNTER — Other Ambulatory Visit: Payer: BLUE CROSS/BLUE SHIELD

## 2019-02-02 MED FILL — BIKTARVY 50-200-25 MG TABS: 50-200-25 | 30 days supply | Qty: 30 | Fill #2

## 2019-02-13 ENCOUNTER — Encounter: Payer: BLUE CROSS/BLUE SHIELD | Admitting: Family

## 2019-03-02 ENCOUNTER — Other Ambulatory Visit: Payer: Self-pay

## 2019-03-02 ENCOUNTER — Other Ambulatory Visit: Payer: Self-pay | Admitting: Family Medicine

## 2019-03-02 ENCOUNTER — Ambulatory Visit (HOSPITAL_COMMUNITY)
Admission: EM | Admit: 2019-03-02 | Discharge: 2019-03-02 | Disposition: A | Discharge status: Home / Self Care | Payer: BLUE CROSS/BLUE SHIELD | Attending: Family Medicine | Admitting: Family Medicine

## 2019-03-02 ENCOUNTER — Encounter (HOSPITAL_COMMUNITY): Payer: Self-pay

## 2019-03-02 DIAGNOSIS — R369 Urethral discharge, unspecified: Secondary | ICD-10-CM | POA: Insufficient documentation

## 2019-03-02 MED ORDER — AZITHROMYCIN 250 MG PO TABS
ORAL_TABLET | ORAL | Status: AC
  Filled 2019-03-02: qty 4

## 2019-03-02 MED ORDER — CEFTRIAXONE SODIUM 250 MG IJ SOLR
INTRAMUSCULAR | Status: AC
  Filled 2019-03-02: qty 250

## 2019-03-02 MED ORDER — CEFTRIAXONE SODIUM 250 MG IJ SOLR
250.0000 mg | Freq: Once | INTRAMUSCULAR | Status: AC
  Administered 2019-03-02: 250 mg via INTRAMUSCULAR

## 2019-03-02 MED ORDER — AZITHROMYCIN 250 MG PO TABS
1000.0000 mg | ORAL_TABLET | Freq: Once | ORAL | Status: AC
  Administered 2019-03-02: 1000 mg via ORAL

## 2019-03-02 NOTE — ED Triage Notes (Signed)
Pt presents with complaints of coming in contact with chlamydia. Pt states he has been having penile discharge and dysuria x 1 day.
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
## 2019-03-02 NOTE — Discharge Instructions (Addendum)
We are giving her treatment for both chlamydia and gonorrhea because of the discharge.  We should have the results back from the test and 48 hours.

## 2019-03-04 LAB — CYTOLOGY, (ORAL, ANAL, URETHRAL) ANCILLARY ONLY
Chlamydia: NEGATIVE
Neisseria Gonorrhea: POSITIVE — AB
Trichomonas: NEGATIVE

## 2019-03-07 MED FILL — BIKTARVY 50-200-25 MG TABS: 50-200-25 | 30 days supply | Qty: 30 | Fill #0

## 2019-03-08 ENCOUNTER — Telehealth (HOSPITAL_COMMUNITY): Payer: Self-pay | Admitting: Emergency Medicine

## 2019-03-08 NOTE — Telephone Encounter (Signed)
Patient contacted and made aware of cytology   results, all questions answered

## 2019-03-08 NOTE — Telephone Encounter (Signed)
Test for gonorrhea was positive. This was treated at the urgent care visit with IM rocephin 250mg and po zithromax 1g. Pt needs education to refrain from sexual intercourse for 7 days after treatment to give the medicine time to work. Sexual partners need to be notified and tested/treated. Condoms may reduce risk of reinfection. Recheck or followup with PCP for further evaluation if symptoms are not improving. [facility] notified.   Attempted to reach patient. No answer at this time. Voicemail left.     

## 2019-03-27 ENCOUNTER — Telehealth: Payer: Self-pay | Admitting: *Deleted

## 2019-03-27 NOTE — Telephone Encounter (Addendum)
Thornton Park, MD  Dalene Seltzer, RN        Would you please follow-up with the patient about his H pylori test? Thanks.    ------------  This RN called the patient to follow up concerning the h pylori special antigen test that was not completed. Left message for the patient to call back.

## 2019-04-06 ENCOUNTER — Encounter: Payer: Self-pay | Admitting: *Deleted

## 2019-04-06 ENCOUNTER — Telehealth: Payer: Self-pay | Admitting: *Deleted

## 2019-04-06 ENCOUNTER — Other Ambulatory Visit: Payer: Self-pay | Admitting: *Deleted

## 2019-04-06 DIAGNOSIS — R112 Nausea with vomiting, unspecified: Secondary | ICD-10-CM

## 2019-04-06 NOTE — Telephone Encounter (Signed)
H. Pylori special antigen test re-ordered for another 12 months  Letter and MyChart message sent to patient to contact the office if he wants his stool tested for H. Pylori.

## 2019-04-14 MED FILL — BIKTARVY 50-200-25 MG TABS: 50-200-25 | 30 days supply | Qty: 30 | Fill #1

## 2019-05-18 MED FILL — BIKTARVY 50-200-25 MG TABS: 50-200-25 | 30 days supply | Qty: 30 | Fill #2

## 2019-06-06 ENCOUNTER — Other Ambulatory Visit: Payer: Self-pay | Admitting: Family

## 2019-06-06 DIAGNOSIS — B20 Human immunodeficiency virus [HIV] disease: Secondary | ICD-10-CM

## 2019-06-07 ENCOUNTER — Telehealth: Payer: Self-pay

## 2019-06-07 NOTE — Telephone Encounter (Signed)
Attempted to contact patient to check in, schedule follow up and see how he's doing on his Biktarvy. Unable to complete call. Previously sent MyChart message. Will attempt again after holidays.   Judge Duque Lorita Officer, RN

## 2019-06-19 MED FILL — BIKTARVY 50-200-25 MG TABS: 50-200-25 | 30 days supply | Qty: 30 | Fill #0

## 2019-07-24 ENCOUNTER — Other Ambulatory Visit: Payer: Self-pay | Admitting: Family

## 2019-07-24 ENCOUNTER — Telehealth: Payer: Self-pay | Admitting: *Deleted

## 2019-07-24 DIAGNOSIS — B20 Human immunodeficiency virus [HIV] disease: Secondary | ICD-10-CM

## 2019-07-24 MED FILL — BIKTARVY 50-200-25 MG TABS: 50-200-25 | 30 days supply | Qty: 30 | Fill #0

## 2019-07-24 NOTE — Telephone Encounter (Signed)
Received refill request 2/8 (last fill sent 06/06/19) for biktarvy. Patient no-showed appointments with Tammy Sours, did not answer previous attempts to contact him. RN left message asking him to call and schedule appointment, sent mychart message with the same. Will send 1 last refill. Andree Coss, RN

## 2019-07-27 ENCOUNTER — Other Ambulatory Visit: Payer: BLUE CROSS/BLUE SHIELD

## 2019-08-10 ENCOUNTER — Ambulatory Visit: Payer: BLUE CROSS/BLUE SHIELD | Admitting: Family

## 2019-08-30 ENCOUNTER — Telehealth: Payer: Self-pay | Admitting: Pharmacy Technician

## 2019-08-30 ENCOUNTER — Ambulatory Visit (INDEPENDENT_AMBULATORY_CARE_PROVIDER_SITE_OTHER): Payer: BLUE CROSS/BLUE SHIELD | Admitting: Pharmacist

## 2019-08-30 ENCOUNTER — Other Ambulatory Visit: Payer: Self-pay

## 2019-08-30 DIAGNOSIS — Z0000 Encounter for general adult medical examination without abnormal findings: Secondary | ICD-10-CM

## 2019-08-30 DIAGNOSIS — Z23 Encounter for immunization: Secondary | ICD-10-CM | POA: Diagnosis not present

## 2019-08-30 DIAGNOSIS — Z113 Encounter for screening for infections with a predominantly sexual mode of transmission: Secondary | ICD-10-CM | POA: Diagnosis not present

## 2019-08-30 DIAGNOSIS — B20 Human immunodeficiency virus [HIV] disease: Secondary | ICD-10-CM | POA: Diagnosis not present

## 2019-08-30 DIAGNOSIS — Z79899 Other long term (current) drug therapy: Secondary | ICD-10-CM

## 2019-08-30 MED ORDER — BIKTARVY 50-200-25 MG PO TABS: 1.0000 | ORAL_TABLET | Freq: Every day | ORAL | 5 refills | Status: DC

## 2019-08-30 MED FILL — BIKTARVY 50-200-25 MG TABS: 50-200-25 | 30 days supply | Qty: 30 | Fill #0

## 2019-08-30 NOTE — Telephone Encounter (Signed)
[facility] Patient Advocate Encounter  Patient last picked up his Biktarvy on 07/25/2019 from [facility]. Multiple attempts were made by the pharmacy and clinic to reach the patient about his appointment and that he will not get refills until seen by a provider.

## 2019-08-30 NOTE — Telephone Encounter (Signed)
  This will be utilized during the grace period claim with his [employer]. He will pick up today at [facility]

## 2019-08-30 NOTE — Progress Notes (Signed)
HPI: Matthew Petersen is a 19 y.o. male who presents to the [facility] pharmacy clinic for HIV follow-up.  Patient Active Problem List   Diagnosis Date Noted  . Contact dermatitis and eczema due to plant 11/17/2018  . Cannabis hyperemesis syndrome concurrent with and due to cannabis abuse (HCC) 09/08/2018  . Healthcare maintenance 09/08/2018  . Marijuana use 08/10/2018  . HIV disease (HCC) 07/21/2018  . Erectile dysfunction 07/19/2018  . Intractable vomiting 02/22/2018  . Mood disorder of depressed type 12/29/2017  . Loss of weight 12/29/2017  . Exposure to STD 12/22/2017    Patient's Medications  New Prescriptions   No medications on file  Previous Medications   BIKTARVY 50-200-25 MG TABS TABLET    TAKE 1 TABLET BY MOUTH DAILY.   HYDROXYZINE (ATARAX/VISTARIL) 25 MG TABLET    Take 1 tablet (25 mg total) by mouth 3 (three) times daily as needed for itching.  Modified Medications   No medications on file  Discontinued Medications   No medications on file    Allergies: No Known Allergies  Past Medical History: Past Medical History:  Diagnosis Date  . HIV (human immunodeficiency virus infection) (HCC)     Social History: Social History   Socioeconomic History  . Marital status: Single    Spouse name: Not on file  . Number of children: 0  . Years of education: 13  . Highest education level: Not on file  Occupational History  . Occupation: Unemployed     Comment: Clinical cytogeneticist at [employer]  . Smoking status: Never Smoker  . Smokeless tobacco: Never Used  Substance and Sexual Activity  . Alcohol use: Not Currently  . Drug use: Yes    Frequency: 7.0 times per week    Types: Marijuana  . Sexual activity: Not on file  Other Topics Concern  . Not on file  Social History Narrative  . Not on file   Social Determinants of Health   Financial Resource Strain:   . Difficulty of Paying Living Expenses:   Food Insecurity:   . Worried About Programme researcher, broadcasting/film/video  in the Last Year:   . Barista in the Last Year:   Transportation Needs:   . Freight forwarder (Medical):   Marland Kitchen Lack of Transportation (Non-Medical):   Physical Activity:   . Days of Exercise per Week:   . Minutes of Exercise per Session:   Stress:   . Feeling of Stress :   Social Connections:   . Frequency of Communication with Friends and Family:   . Frequency of Social Gatherings with Friends and Family:   . Attends Religious Services:   . Active Member of Clubs or Organizations:   . Attends Banker Meetings:   Marland Kitchen Marital Status:     Labs: Lab Results  Component Value Date   HIV1RNAQUANT <20 DETECTED (A) 11/22/2018   HIV1RNAQUANT 140 (H) 09/08/2018   HIV1RNAQUANT 139,000 (H) 07/25/2018   CD4TABS 961 11/22/2018   CD4TABS 760 07/25/2018    RPR and STI Lab Results  Component Value Date   LABRPR NON-REACTIVE 07/25/2018   LABRPR Non Reactive 01/20/2017    STI Results GC CT  03/02/2019 **POSITIVE**(A) Negative  07/25/2018 Negative Negative  03/14/2018 Negative Negative  03/14/2018 **POSITIVE**(A) Negative  12/22/2017 Negative Negative  01/20/2017 Negative Negative    Hepatitis B Lab Results  Component Value Date   HEPBSAB NON-REACTIVE 07/25/2018   HEPBSAG NON-REACTIVE 07/25/2018   HEPBCAB NON-REACTIVE  07/25/2018   Hepatitis C Lab Results  Component Value Date   HEPCAB NON-REACTIVE 07/25/2018   Hepatitis A Lab Results  Component Value Date   HAV REACTIVE (A) 07/25/2018   Lipids: Lab Results  Component Value Date   CHOL 182 (H) 07/25/2018   TRIG 69 07/25/2018   HDL 48 07/25/2018   CHOLHDL 3.8 07/25/2018   LDLCALC 118 (H) 07/25/2018    Current HIV Regimen: Biktarvy  Assessment: TJ is here today to get refills on his Biktarvy. Overall he is doing well and has no concerns. He has been without medication now for 4 days. His Hep B Ab was non-reactive indicating a need for protection.  Works at [employer].  Plan: 1)  Labs: STI screening (urine cytology), HIV RNA Viral, CD4, BMP, liver panel  2) Refill Biktarvy at [facility] outpatinet 3) Hep B: Heplisav-B  Right arm  Lot: [id number]   Exp: 08/28/2020 4) F/U 1 month for [place] Student Pharmacist, [employer] for Infectious Disease 08/30/2019, 9:56 AM

## 2019-08-31 LAB — URINE CYTOLOGY ANCILLARY ONLY
Chlamydia: NEGATIVE
Comment: NEGATIVE
Comment: NORMAL
Neisseria Gonorrhea: NEGATIVE

## 2019-08-31 LAB — T-HELPER CELL (CD4) - (RCID CLINIC ONLY)
CD4 % Helper T Cell: 32 % — ABNORMAL LOW (ref 33–65)
CD4 T Cell Abs: 975 /uL (ref 400–1790)

## 2019-09-01 LAB — COMPREHENSIVE METABOLIC PANEL
AG Ratio: 2.1 (calc) (ref 1.0–2.5)
ALT: 25 U/L (ref 9–46)
AST: 23 U/L (ref 10–40)
Albumin: 4.4 g/dL (ref 3.6–5.1)
Alkaline phosphatase (APISO): 55 U/L (ref 36–130)
BUN: 12 mg/dL (ref 7–25)
CO2: 28 mmol/L (ref 20–32)
Calcium: 9.3 mg/dL (ref 8.6–10.3)
Chloride: 107 mmol/L (ref 98–110)
Creat: 0.99 mg/dL (ref 0.60–1.35)
Globulin: 2.1 g/dL (calc) (ref 1.9–3.7)
Glucose, Bld: 106 mg/dL — ABNORMAL HIGH (ref 65–99)
Potassium: 4.2 mmol/L (ref 3.5–5.3)
Sodium: 141 mmol/L (ref 135–146)
Total Bilirubin: 0.7 mg/dL (ref 0.2–1.2)
Total Protein: 6.5 g/dL (ref 6.1–8.1)

## 2019-09-01 LAB — RPR: RPR Ser Ql: REACTIVE — AB

## 2019-09-01 LAB — CBC
HCT: 42.3 % (ref 38.5–50.0)
Hemoglobin: 13.7 g/dL (ref 13.2–17.1)
MCH: 27 pg (ref 27.0–33.0)
MCHC: 32.4 g/dL (ref 32.0–36.0)
MCV: 83.3 fL (ref 80.0–100.0)
MPV: 11.1 fL (ref 7.5–12.5)
Platelets: 149 10*3/uL (ref 140–400)
RBC: 5.08 10*6/uL (ref 4.20–5.80)
RDW: 13.6 % (ref 11.0–15.0)
WBC: 4.7 10*3/uL (ref 3.8–10.8)

## 2019-09-01 LAB — LIPID PANEL
Cholesterol: 195 mg/dL (ref <200)
HDL: 50 mg/dL (ref >=40)
LDL Cholesterol (Calc): 123 mg/dL (calc) — ABNORMAL HIGH
Non-HDL Cholesterol (Calc): 145 mg/dL (calc) — ABNORMAL HIGH (ref <130)
Total CHOL/HDL Ratio: 3.9 (calc) (ref <5.0)
Triglycerides: 113 mg/dL (ref <150)

## 2019-09-01 LAB — HIV-1 RNA QUANT-NO REFLEX-BLD
HIV 1 RNA Quant: <20 copies/mL — AB
HIV-1 RNA Quant, Log: <1.3 Log copies/mL — AB

## 2019-09-01 LAB — FLUORESCENT TREPONEMAL AB(FTA)-IGG-BLD: Fluorescent Treponemal ABS: NONREACTIVE

## 2019-09-01 LAB — RPR TITER: RPR Titer: 1:1 {titer} — ABNORMAL HIGH

## 2019-09-01 NOTE — Telephone Encounter (Signed)
[facility] Patient Advocate Encounter   Patient has been approved for [employer] Advancing Access Patient Assistance Program for [place] for up to 12 months beginning 08/30/2019. This assistance will make the patient's copay $0.  The billing information is listed below. He picked up a refill on 03/17 at [facility].  Patient knows to call the office with questions or concerns.  Beulah Gandy, CPhT Specialty Pharmacy Patient [facility] for Infectious Disease Phone: [phone] Fax: [phone] 09/01/2019 8:58 AM

## 2019-09-12 ENCOUNTER — Encounter (HOSPITAL_COMMUNITY): Payer: Self-pay | Admitting: Emergency Medicine

## 2019-09-12 ENCOUNTER — Emergency Department (HOSPITAL_COMMUNITY)
Admission: EM | Admit: 2019-09-12 | Discharge: 2019-09-12 | Disposition: A | Discharge status: Home / Self Care | Payer: BLUE CROSS/BLUE SHIELD | Attending: Emergency Medicine | Admitting: Emergency Medicine

## 2019-09-12 ENCOUNTER — Other Ambulatory Visit: Payer: Self-pay

## 2019-09-12 DIAGNOSIS — R112 Nausea with vomiting, unspecified: Secondary | ICD-10-CM | POA: Insufficient documentation

## 2019-09-12 DIAGNOSIS — Z21 Asymptomatic human immunodeficiency virus [HIV] infection status: Secondary | ICD-10-CM | POA: Insufficient documentation

## 2019-09-12 DIAGNOSIS — R1111 Vomiting without nausea: Secondary | ICD-10-CM

## 2019-09-12 LAB — COMPREHENSIVE METABOLIC PANEL
ALT: 26 U/L (ref 0–44)
AST: 35 U/L (ref 15–41)
Albumin: 5.2 g/dL — ABNORMAL HIGH (ref 3.5–5.0)
Alkaline Phosphatase: 57 U/L (ref 38–126)
Anion gap: 13 (ref 5–15)
BUN: 21 mg/dL — ABNORMAL HIGH (ref 6–20)
CO2: 25 mmol/L (ref 22–32)
Calcium: 9.6 mg/dL (ref 8.9–10.3)
Chloride: 104 mmol/L (ref 98–111)
Creatinine, Ser: 1.15 mg/dL (ref 0.61–1.24)
GFR calc Af Amer: >60 mL/min (ref >60)
GFR calc non Af Amer: >60 mL/min (ref >60)
Glucose, Bld: 116 mg/dL — ABNORMAL HIGH (ref 70–99)
Potassium: 3.3 mmol/L — ABNORMAL LOW (ref 3.5–5.1)
Sodium: 142 mmol/L (ref 135–145)
Total Bilirubin: 1.5 mg/dL — ABNORMAL HIGH (ref 0.3–1.2)
Total Protein: 8.3 g/dL — ABNORMAL HIGH (ref 6.5–8.1)

## 2019-09-12 LAB — CBC
HCT: 48 % (ref 39.0–52.0)
Hemoglobin: 15.5 g/dL (ref 13.0–17.0)
MCH: 26.7 pg (ref 26.0–34.0)
MCHC: 32.3 g/dL (ref 30.0–36.0)
MCV: 82.8 fL (ref 80.0–100.0)
Platelets: 218 10*3/uL (ref 150–400)
RBC: 5.8 MIL/uL (ref 4.22–5.81)
RDW: 13.2 % (ref 11.5–15.5)
WBC: 8.9 10*3/uL (ref 4.0–10.5)
nRBC: 0 % (ref 0.0–0.2)

## 2019-09-12 LAB — CBG MONITORING, ED: Glucose-Capillary: 105 mg/dL — ABNORMAL HIGH (ref 70–99)

## 2019-09-12 LAB — LIPASE, BLOOD: Lipase: 16 U/L (ref 11–51)

## 2019-09-12 MED ORDER — ONDANSETRON HCL 4 MG/2ML IJ SOLN: 4.0000 mg | Freq: Once | INTRAMUSCULAR | Status: DC

## 2019-09-12 MED ORDER — SODIUM CHLORIDE 0.9 % IV BOLUS
1000.0000 mL | Freq: Once | INTRAVENOUS | Status: AC
  Administered 2019-09-12: 1000 mL via INTRAVENOUS

## 2019-09-12 MED ORDER — DIPHENHYDRAMINE HCL 50 MG/ML IJ SOLN
25.0000 mg | Freq: Once | INTRAMUSCULAR | Status: AC
  Administered 2019-09-12: 12:00:00 25 mg via INTRAVENOUS
  Filled 2019-09-12: qty 1

## 2019-09-12 MED ORDER — ONDANSETRON HCL 4 MG PO TABS: 4.0000 mg | ORAL_TABLET | Freq: Three times a day (TID) | ORAL | 0 refills | Status: DC | PRN

## 2019-09-12 MED ORDER — ONDANSETRON HCL 4 MG/2ML IJ SOLN
4.0000 mg | Freq: Once | INTRAMUSCULAR | Status: DC
  Filled 2019-09-12: qty 2

## 2019-09-12 MED ORDER — HALOPERIDOL LACTATE 5 MG/ML IJ SOLN
5.0000 mg | Freq: Once | INTRAMUSCULAR | Status: AC
  Administered 2019-09-12: 5 mg via INTRAVENOUS
  Filled 2019-09-12: qty 1

## 2019-09-12 NOTE — ED Provider Notes (Signed)
[facility]EM[facility]Y [facility]PT Provider Note   CSN: [id number] Arrival date & time: 09/12/19  1006     History Chief Complaint  Patient presents with  . Emesis    Matthew Petersen is a 19 y.o. male.  The history is provided by the patient.  Emesis Severity:  Mild Duration:  2 days Timing:  Constant Able to tolerate:  Liquids Progression:  Unchanged Chronicity:  New Recent urination:  Normal Worsened by:  Nothing Associated symptoms: no abdominal pain, no arthralgias, no chills, no cough, no diarrhea, no fever, no headaches, no myalgias, no sore throat and no URI   Risk factors: no suspect food intake        Past Medical History:  Diagnosis Date  . HIV (human immunodeficiency virus infection) [facility])     Patient Active Problem List   Diagnosis Date Noted  . Contact dermatitis and eczema due to plant 11/17/2018  . Cannabis hyperemesis syndrome concurrent with and due to cannabis abuse (HCC) 09/08/2018  . Healthcare maintenance 09/08/2018  . Marijuana use 08/10/2018  . HIV disease (HCC) 07/21/2018  . Erectile dysfunction 07/19/2018  . Intractable vomiting 02/22/2018  . Mood disorder of depressed type 12/29/2017  . Loss of weight 12/29/2017  . Exposure to STD 12/22/2017    History reviewed. No pertinent surgical history.     Family History  Problem Relation Age of Onset  . Diabetes Mother   . Multiple sclerosis Father     Social History   Tobacco Use  . Smoking status: Never Smoker  . Smokeless tobacco: Never Used  Substance Use Topics  . Alcohol use: Not Currently  . Drug use: Yes    Frequency: 7.0 times per week    Types: Marijuana    Home Medications Prior to Admission medications   Medication Sig Start Date End Date Taking? Authorizing Provider  bictegravir-emtricitabine-tenofovir AF (BIKTARVY) 50-200-25 MG TABS tablet Take 1 tablet by mouth daily. 08/30/19  Yes Kuppelweiser, Cassie L, RPH-CPP  hydrOXYzine (ATARAX/VISTARIL) 25  MG tablet Take 1 tablet (25 mg total) by mouth 3 (three) times daily as needed for itching. 11/15/18  Yes Blanchard Kelch, NP  ondansetron (ZOFRAN) 4 MG tablet Take 1 tablet (4 mg total) by mouth every 8 (eight) hours as needed for up to 20 doses for nausea or vomiting. 09/12/19   Virgina Norfolk, DO    Allergies    Haldol [haloperidol]  Review of Systems   Review of Systems  Constitutional: Negative for chills and fever.  HENT: Negative for ear pain and sore throat.   Eyes: Negative for pain and visual disturbance.  Respiratory: Negative for cough and shortness of breath.   Cardiovascular: Negative for chest pain and palpitations.  Gastrointestinal: Positive for nausea and vomiting. Negative for abdominal pain and diarrhea.  Genitourinary: Negative for dysuria and hematuria.  Musculoskeletal: Negative for arthralgias, back pain and myalgias.  Skin: Negative for color change and rash.  Neurological: Negative for seizures, syncope and headaches.  All other systems reviewed and are negative.   Physical Exam Updated Vital Signs  ED Triage Vitals  Enc Vitals Group     BP 09/12/19 1017 (!) 145/81     Pulse Rate 09/12/19 1017 60     Resp 09/12/19 1017 20     Temp 09/12/19 1017 98.1 F (36.7 C)     Temp Source 09/12/19 1017 Oral     SpO2 09/12/19 1017 100 %     Weight --  Height --      Head Circumference --      Peak Flow --      Pain Score 09/12/19 1019 0     Pain Loc --      Pain Edu? --      Excl. in [place]? --     Physical Exam Vitals and nursing note reviewed.  Constitutional:      General: He is not in acute distress.    Appearance: He is well-developed. He is not ill-appearing.  HENT:     Head: Normocephalic and atraumatic.     Nose: Nose normal.     Mouth/Throat:     Mouth: Mucous membranes are moist.  Eyes:     Extraocular Movements: Extraocular movements intact.     Conjunctiva/sclera: Conjunctivae normal.     Pupils: Pupils are equal, round, and reactive  to light.  Cardiovascular:     Rate and Rhythm: Normal rate and regular rhythm.     Pulses: Normal pulses.     Heart sounds: Normal heart sounds. No murmur.  Pulmonary:     Effort: Pulmonary effort is normal. No respiratory distress.     Breath sounds: Normal breath sounds.  Abdominal:     General: Abdomen is flat. There is no distension.     Palpations: Abdomen is soft.     Tenderness: There is no abdominal tenderness.  Musculoskeletal:     Cervical back: Normal range of motion and neck supple.  Skin:    General: Skin is warm and dry.     Capillary Refill: Capillary refill takes less than 2 seconds.  Neurological:     General: No focal deficit present.     Mental Status: He is alert.  Psychiatric:        Mood and Affect: Mood normal.     ED Results / Procedures / Treatments   Labs (all labs ordered are listed, but only abnormal results are displayed) Labs Reviewed  COMPREHENSIVE METABOLIC PANEL - Abnormal; Notable for the following components:      Result Value   Potassium 3.3 (*)    Glucose, Bld 116 (*)    BUN 21 (*)    Total Protein 8.3 (*)    Albumin 5.2 (*)    Total Bilirubin 1.5 (*)    All other components within normal limits  CBG MONITORING, ED - Abnormal; Notable for the following components:   Glucose-Capillary 105 (*)    All other components within normal limits  LIPASE, BLOOD  CBC    EKG None  Radiology No results found.  Procedures Procedures (including critical care time)  Medications Ordered in ED Medications  sodium chloride 0.9 % bolus 1,000 mL (0 mLs Intravenous Stopped 09/12/19 1157)  haloperidol lactate (HALDOL) injection 5 mg (5 mg Intravenous Given 09/12/19 1048)  diphenhydrAMINE (BENADRYL) injection 25 mg (25 mg Intravenous Given 09/12/19 1152)    ED Course  I have reviewed the triage vital signs and the nursing notes.  Pertinent labs & imaging results that were available during my care of the patient were reviewed by me and  considered in my medical decision making (see chart for details).    MDM Rules/Calculators/A&P                      Matthew Petersen is a 19 year old male with history of HIV who presents to the ED with nausea and vomiting.  History of the same thought to be from marijuana.  Patient  with normal vitals.  No fever.  Symptoms for the last several days.  Last smoked marijuana Friday.  Patient with no abdominal tenderness.  Has been compliant with his HIV medications.  No fever.  No diarrhea.  Last CD4 count and HIV quant improved.  Patient with overall unremarkable labs.  Nausea vomiting improved with Haldol however patient did have mild dystonic reaction from this.  Got better with Benadryl.  Felt better after IV fluids.  Was able to tolerate p.o.  Offered him further hydration and care following his mild dystonic reaction but he prefers to be discharged.  Given prescription for Zofran.  No concern for intra-abdominal process including appendicitis, bowel obstruction.  Suspect likely hyperemesis in the setting of marijuana use.  Given return precautions and discharged from ED in good condition.  This chart was dictated using voice recognition software.  Despite best efforts to proofread,  errors can occur which can change the documentation meaning.      Final Clinical Impression(s) / ED Diagnoses Final diagnoses:  Vomiting without nausea, intractability of vomiting not specified, unspecified vomiting type    Rx / DC Orders ED Discharge Orders         Ordered    ondansetron (ZOFRAN) 4 MG tablet  Every 8 hours PRN     09/12/19 1204           Martin Smeal, DO 09/12/19 1208

## 2019-09-12 NOTE — ED Notes (Signed)
Patient noted to be anxious in room. Saying he needed to rip out his IV. Patient unable to remain still. Dr. Lockie Mola at bedside. Actions deemed an adverse reaction to Haldol. Orders for benadryl given. Patient still insisting on leaving, saying he is too anxious and cannot sit here.

## 2019-09-12 NOTE — ED Triage Notes (Signed)
Per pt, states he has been vomiting for 2 days straight-unable to keep anything down-no pain or diarrhea

## 2019-09-29 MED FILL — BIKTARVY 50-200-25 MG TABS: 50-200-25 | 30 days supply | Qty: 30 | Fill #1

## 2019-10-11 ENCOUNTER — Ambulatory Visit: Payer: BLUE CROSS/BLUE SHIELD | Admitting: Pharmacist

## 2019-11-10 MED FILL — BIKTARVY 50-200-25 MG TABS: 50-200-25 | 30 days supply | Qty: 30 | Fill #2

## 2019-12-14 MED FILL — BIKTARVY 50-200-25 MG TABS: 50-200-25 | 30 days supply | Qty: 30 | Fill #3

## 2020-01-15 MED FILL — BIKTARVY 50-200-25 MG TABS: 50-200-25 | 30 days supply | Qty: 30 | Fill #4

## 2020-02-12 MED FILL — BIKTARVY 50-200-25 MG TABS: 50-200-25 | 30 days supply | Qty: 30 | Fill #5

## 2020-03-08 ENCOUNTER — Other Ambulatory Visit: Payer: Self-pay | Admitting: Pharmacist

## 2020-03-08 DIAGNOSIS — B20 Human immunodeficiency virus [HIV] disease: Secondary | ICD-10-CM

## 2020-03-08 NOTE — Telephone Encounter (Signed)
Needs office visit.

## 2020-03-11 MED FILL — BIKTARVY 50-200-25 MG TABS: 50-200-25 | 30 days supply | Qty: 30 | Fill #0

## 2020-04-19 ENCOUNTER — Telehealth: Payer: Self-pay | Admitting: *Deleted

## 2020-04-19 ENCOUNTER — Telehealth: Payer: Self-pay

## 2020-04-19 NOTE — Telephone Encounter (Signed)
[facility] Patient Advocate Encounter  [facility] and I have been unsuccsessful in reaching patient to be able to refill medication.    We have tried multiple times without a response.  Darren Nodal, CPhT Specialty Pharmacy Patient [facility] Phone: [phone] Fax:  [phone]  

## 2020-04-19 NOTE — Telephone Encounter (Signed)
Patient returning a phone call.  RN unable to see who contacted him, but he is overdue for visit as he was last seen in March.  Patient reports taking Biktarvy daily with few missed doses. He gets this from [facility].  He states he has insurance (his mother's), will meet with Roe Coombs for financial and Tammy Sours for appointment 11/10. Andree Coss, RN

## 2020-04-23 ENCOUNTER — Other Ambulatory Visit: Payer: Self-pay | Admitting: Family

## 2020-04-23 DIAGNOSIS — B20 Human immunodeficiency virus [HIV] disease: Secondary | ICD-10-CM

## 2020-04-24 ENCOUNTER — Encounter: Payer: Self-pay | Admitting: Family

## 2020-04-24 ENCOUNTER — Ambulatory Visit: Payer: Self-pay

## 2020-04-24 ENCOUNTER — Ambulatory Visit (INDEPENDENT_AMBULATORY_CARE_PROVIDER_SITE_OTHER): Payer: BLUE CROSS/BLUE SHIELD | Admitting: Family

## 2020-04-24 ENCOUNTER — Other Ambulatory Visit: Payer: Self-pay

## 2020-04-24 ENCOUNTER — Other Ambulatory Visit: Payer: Self-pay | Admitting: Family

## 2020-04-24 VITALS — BP 134/74 | HR 127 | Wt 182.0 lb

## 2020-04-24 DIAGNOSIS — Z113 Encounter for screening for infections with a predominantly sexual mode of transmission: Secondary | ICD-10-CM | POA: Diagnosis not present

## 2020-04-24 DIAGNOSIS — B20 Human immunodeficiency virus [HIV] disease: Secondary | ICD-10-CM | POA: Diagnosis not present

## 2020-04-24 DIAGNOSIS — Z23 Encounter for immunization: Secondary | ICD-10-CM

## 2020-04-24 DIAGNOSIS — Z0000 Encounter for general adult medical examination without abnormal findings: Secondary | ICD-10-CM | POA: Diagnosis not present

## 2020-04-24 DIAGNOSIS — Z79899 Other long term (current) drug therapy: Secondary | ICD-10-CM

## 2020-04-24 DIAGNOSIS — F4321 Adjustment disorder with depressed mood: Secondary | ICD-10-CM

## 2020-04-24 DIAGNOSIS — F1290 Cannabis use, unspecified, uncomplicated: Secondary | ICD-10-CM

## 2020-04-24 MED ORDER — BIKTARVY 50-200-25 MG PO TABS: 1.0000 | ORAL_TABLET | Freq: Every day | ORAL | 3 refills | Status: DC

## 2020-04-24 NOTE — Patient Instructions (Addendum)
Nice to see you.  We will check your blood work today.  Continue to take your [place] daily as prescribed.  Refills be sent to the pharmacy.  We will get you established for a dental appointment for routine dental care.  Plan for follow-up in 3 months or sooner if needed with lab work on the same day.  Have a great day and stay safe!

## 2020-04-24 NOTE — Assessment & Plan Note (Signed)
Matthew Petersen continues to have well-controlled HIV disease with good adherence and tolerance to his ART regimen of Biktarvy.  No signs/symptoms of opportunistic infection or progressive HIV disease.  It certainly is possible Biktarvy would be contributing to migraines although his symptoms have not changed recently and wishes to stay on medication.  Only way to determine Matthew Petersen is relation to migraines will be to change medications.  Reviewed previous lab work and discussed plan of care.  Continue current dose of Biktarvy.  Check blood work today.  Plan for follow-up in 3 months or sooner if needed with lab work on the same day.

## 2020-04-24 NOTE — Assessment & Plan Note (Signed)
Matthew Petersen is experiencing grief secondary to loss of his grandfather and great grandfather.  Discussed grieving process and counseling options that are available if necessary.  Does not appear to be complicated at this time.  Continue to monitor as needed.

## 2020-04-24 NOTE — Assessment & Plan Note (Addendum)
Continues to smoke marijuana 3-6 times per week on average.  Counseling provided.

## 2020-04-24 NOTE — Assessment & Plan Note (Signed)
   Hepatitis B and Prevnar updated today.  Discussed/recommended Covid vaccine.  Due for routine dental care with referral placed to [facility]  Discussed importance of safe sexual practice to reduce risk of STI.  Condoms provided.

## 2020-04-24 NOTE — Progress Notes (Signed)
Subjective:    Patient ID: Matthew Petersen, male    DOB: 2000-11-28, 19 y.o.   MRN: [id number]  Chief Complaint  Patient presents with   Follow-up    reports increased depression; declined flu shot; offered condoms;      HPI:  Matthew Petersen is a 19 y.o. male with HIV disease who was last seen in the office on 08/30/19 by our clinical pharmacist with good adherence to his ART regimen of Biktarvy. Lab work at the time showed a viral load that was undetectable and CD4 count of 975. RPR was positive at 1:1 with negative Treponemal.  Here today for routine follow-up.  Matthew Petersen continues to take his [name] daily as prescribed with no missed doses since his last office visit.  Has been having migraines which she is unsure if they are related to [place].  Has had previous migraines and has not noted any changes since starting Biktarvy.  Would like to stay on Biktarvy.  Otherwise feeling well today. Denies fevers, chills, night sweats, changes in vision, neck pain/stiffness, nausea, diarrhea, vomiting, lesions or rashes.  Matthew Petersen has no problems obtaining his medication from the pharmacy.  Has had increased levels of depression recently secondary to multiple losses of family members.  He is coping and has several support sources.  Denies suicidal ideation.  Smokes marijuana often.  No tobacco use or alcohol consumption.  Due for routine dental care.  Declines influenza vaccination.  Considering Covid vaccine.  Allergies  Allergen Reactions   Haldol [Haloperidol]     Dystonic reaction      Outpatient Medications Prior to Visit  Medication Sig Dispense Refill   BIKTARVY 50-200-25 MG TABS tablet TAKE 1 TABLET BY MOUTH DAILY. 30 tablet 0   hydrOXYzine (ATARAX/VISTARIL) 25 MG tablet Take 1 tablet (25 mg total) by mouth 3 (three) times daily as needed for itching. 60 tablet 0   ondansetron (ZOFRAN) 4 MG tablet Take 1 tablet (4 mg total) by mouth every 8 (eight) hours as needed for  up to 20 doses for nausea or vomiting. 20 tablet 0   No facility-administered medications prior to visit.     Past Medical History:  Diagnosis Date   HIV (human immunodeficiency virus infection) (HCC)      History reviewed. No pertinent surgical history.     Review of Systems  Constitutional: Negative for appetite change, chills, fatigue, fever and unexpected weight change.  Eyes: Negative for visual disturbance.  Respiratory: Negative for cough, chest tightness, shortness of breath and wheezing.   Cardiovascular: Negative for chest pain and leg swelling.  Gastrointestinal: Negative for abdominal pain, constipation, diarrhea, nausea and vomiting.  Genitourinary: Negative for dysuria, flank pain, frequency, genital sores, hematuria and urgency.  Skin: Negative for rash.  Allergic/Immunologic: Negative for immunocompromised state.  Neurological: Positive for headaches. Negative for dizziness.      Objective:    BP 134/74    Pulse (!) 127    Wt 182 lb (82.6 kg)    BMI 26.88 kg/m  Nursing note and vital signs reviewed.  Physical Exam Constitutional:      General: He is not in acute distress.    Appearance: He is well-developed.  Eyes:     Conjunctiva/sclera: Conjunctivae normal.  Cardiovascular:     Rate and Rhythm: Normal rate and regular rhythm.     Heart sounds: Normal heart sounds. No murmur heard.  No friction rub. No gallop.   Pulmonary:     Effort:  Pulmonary effort is normal. No respiratory distress.     Breath sounds: Normal breath sounds. No wheezing or rales.  Chest:     Chest wall: No tenderness.  Abdominal:     General: Bowel sounds are normal.     Palpations: Abdomen is soft.     Tenderness: There is no abdominal tenderness.  Musculoskeletal:     Cervical back: Neck supple.  Lymphadenopathy:     Cervical: No cervical adenopathy.  Skin:    General: Skin is warm and dry.     Findings: No rash.  Neurological:     Mental Status: He is alert and  oriented to person, place, and time.  Psychiatric:        Behavior: Behavior normal.        Thought Content: Thought content normal.        Judgment: Judgment normal.      Depression screen [facility] 2/9 04/24/2020 11/22/2018 09/08/2018 08/10/2018 07/19/2018  Decreased Interest 0 0 0 0 0  Down, Depressed, Hopeless 0 0 0 0 0  PHQ - 2 Score 0 0 0 0 0  Altered sleeping - - - - -  Tired, decreased energy - - - - -  Change in appetite - - - - -  Feeling bad or failure about yourself  - - - - -  Trouble concentrating - - - - -  Moving slowly or fidgety/restless - - - - -  Suicidal thoughts - - - - -  PHQ-9 Score - - - - -  Difficult doing work/chores - - - - -       Assessment & Plan:    Patient Active Problem List   Diagnosis Date Noted   Grief 04/24/2020   Contact dermatitis and eczema due to plant 11/17/2018   Cannabis hyperemesis syndrome concurrent with and due to cannabis abuse (HCC) 09/08/2018   Healthcare maintenance 09/08/2018   Marijuana use 08/10/2018   HIV disease (HCC) 07/21/2018   Erectile dysfunction 07/19/2018   Intractable vomiting 02/22/2018   Mood disorder of depressed type 12/29/2017   Loss of weight 12/29/2017   Exposure to STD 12/22/2017     Problem List Items Addressed This Visit      Other   HIV disease (HCC) - Primary    Matthew Petersen continues to have well-controlled HIV disease with good adherence and tolerance to his ART regimen of Biktarvy.  No signs/symptoms of opportunistic infection or progressive HIV disease.  It certainly is possible Biktarvy would be contributing to migraines although his symptoms have not changed recently and wishes to stay on medication.  Only way to determine [name] is relation to migraines will be to change medications.  Reviewed previous lab work and discussed plan of care.  Continue current dose of Biktarvy.  Check blood work today.  Plan for follow-up in 3 months or sooner if needed with lab work on the same day.        Relevant Medications   bictegravir-emtricitabine-tenofovir AF (BIKTARVY) 50-200-25 MG TABS tablet   Other Relevant Orders   COMPLETE METABOLIC PANEL WITH GFR   HIV-1 RNA quant-no reflex-bld   T-helper cell (CD4)- ([facility] clinic only)   Marijuana use    Continues to smoke marijuana 3-6 times per week on average.  Counseling provided.      Healthcare maintenance     Hepatitis B and Prevnar updated today.  Discussed/recommended Covid vaccine.  Due for routine dental care with referral placed to [facility]  Discussed importance  of safe sexual practice to reduce risk of STI.  Condoms provided.      Grief    Matthew Petersen is experiencing grief secondary to loss of his grandfather and great grandfather.  Discussed grieving process and counseling options that are available if necessary.  Does not appear to be complicated at this time.  Continue to monitor as needed.       Other Visit Diagnoses    Screening for STDs (sexually transmitted diseases)       Relevant Orders   RPR   Pharmacologic therapy       Relevant Orders   Lipid panel       I have changed Tyra T. Mchan's Biktarvy. I am also having him maintain his hydrOXYzine and ondansetron.   Meds ordered this encounter  Medications   bictegravir-emtricitabine-tenofovir AF (BIKTARVY) 50-200-25 MG TABS tablet    Sig: Take 1 tablet by mouth daily.    Dispense:  30 tablet    Refill:  3    Order Specific Question:   Supervising Provider    Answer:   Judyann Munson [[id number]]     Follow-up: Return in about 3 months (around 07/25/2020), or if symptoms worsen or fail to improve.   Marcos Eke, MSN, FNP-C Nurse Practitioner [facility] for Infectious Disease [facility] Main number: [phone]

## 2020-04-25 LAB — T-HELPER CELL (CD4) - (RCID CLINIC ONLY)
CD4 % Helper T Cell: 29 % — ABNORMAL LOW (ref 33–65)
CD4 T Cell Abs: 775 /uL (ref 400–1790)

## 2020-04-25 MED FILL — BIKTARVY 50-200-25 MG TABS: 50-200-25 | 30 days supply | Qty: 30 | Fill #0

## 2020-04-27 LAB — COMPLETE METABOLIC PANEL WITH GFR
AG Ratio: 1.8 (calc) (ref 1.0–2.5)
ALT: 16 U/L (ref 9–46)
AST: 27 U/L (ref 10–40)
Albumin: 4.2 g/dL (ref 3.6–5.1)
Alkaline phosphatase (APISO): 61 U/L (ref 36–130)
BUN: 9 mg/dL (ref 7–25)
CO2: 26 mmol/L (ref 20–32)
Calcium: 9.2 mg/dL (ref 8.6–10.3)
Chloride: 106 mmol/L (ref 98–110)
Creat: 1.08 mg/dL (ref 0.60–1.35)
GFR, Est African American: 113 mL/min/{1.73_m2} (ref >=60)
GFR, Est Non African American: 98 mL/min/{1.73_m2} (ref >=60)
Globulin: 2.3 g/dL (calc) (ref 1.9–3.7)
Glucose, Bld: 93 mg/dL (ref 65–99)
Potassium: 4 mmol/L (ref 3.5–5.3)
Sodium: 140 mmol/L (ref 135–146)
Total Bilirubin: 0.3 mg/dL (ref 0.2–1.2)
Total Protein: 6.5 g/dL (ref 6.1–8.1)

## 2020-04-27 LAB — LIPID PANEL
Cholesterol: 171 mg/dL (ref <200)
HDL: 35 mg/dL — ABNORMAL LOW (ref >=40)
LDL Cholesterol (Calc): 107 mg/dL (calc) — ABNORMAL HIGH
Non-HDL Cholesterol (Calc): 136 mg/dL (calc) — ABNORMAL HIGH (ref <130)
Total CHOL/HDL Ratio: 4.9 (calc) (ref <5.0)
Triglycerides: 177 mg/dL — ABNORMAL HIGH (ref <150)

## 2020-04-27 LAB — HIV-1 RNA QUANT-NO REFLEX-BLD
HIV 1 RNA Quant: <20 Copies/mL — ABNORMAL HIGH
HIV-1 RNA Quant, Log: <1.3 Log cps/mL — ABNORMAL HIGH

## 2020-04-27 LAB — RPR: RPR Ser Ql: NONREACTIVE

## 2020-05-12 ENCOUNTER — Other Ambulatory Visit: Payer: Self-pay

## 2020-05-12 ENCOUNTER — Emergency Department (HOSPITAL_COMMUNITY)
Admission: EM | Admit: 2020-05-12 | Discharge: 2020-05-12 | Disposition: A | Discharge status: Home / Self Care | Payer: BLUE CROSS/BLUE SHIELD | Attending: Emergency Medicine | Admitting: Emergency Medicine

## 2020-05-12 ENCOUNTER — Encounter (HOSPITAL_COMMUNITY): Payer: Self-pay | Admitting: Emergency Medicine

## 2020-05-12 DIAGNOSIS — Z79899 Other long term (current) drug therapy: Secondary | ICD-10-CM | POA: Insufficient documentation

## 2020-05-12 DIAGNOSIS — X102XXA Contact with fats and cooking oils, initial encounter: Secondary | ICD-10-CM | POA: Insufficient documentation

## 2020-05-12 DIAGNOSIS — Z23 Encounter for immunization: Secondary | ICD-10-CM | POA: Insufficient documentation

## 2020-05-12 DIAGNOSIS — Z21 Asymptomatic human immunodeficiency virus [HIV] infection status: Secondary | ICD-10-CM | POA: Diagnosis not present

## 2020-05-12 DIAGNOSIS — T23221A Burn of second degree of single right finger (nail) except thumb, initial encounter: Secondary | ICD-10-CM | POA: Diagnosis not present

## 2020-05-12 DIAGNOSIS — T300 Burn of unspecified body region, unspecified degree: Secondary | ICD-10-CM

## 2020-05-12 DIAGNOSIS — S6991XA Unspecified injury of right wrist, hand and finger(s), initial encounter: Secondary | ICD-10-CM | POA: Diagnosis present

## 2020-05-12 MED ORDER — OXYCODONE-ACETAMINOPHEN 5-325 MG PO TABS: 2.0000 | ORAL_TABLET | Freq: Four times a day (QID) | ORAL | 0 refills | Status: DC | PRN

## 2020-05-12 MED ORDER — ACETAMINOPHEN 325 MG PO TABS
650.0000 mg | ORAL_TABLET | Freq: Once | ORAL | Status: AC
  Administered 2020-05-12: 650 mg via ORAL
  Filled 2020-05-12: qty 2

## 2020-05-12 MED ORDER — SILVER SULFADIAZINE 1 % EX CREA
TOPICAL_CREAM | Freq: Once | CUTANEOUS | Status: AC
  Filled 2020-05-12: qty 50

## 2020-05-12 MED ORDER — TETANUS-DIPHTH-ACELL PERTUSSIS 5-2.5-18.5 LF-MCG/0.5 IM SUSY
0.5000 mL | PREFILLED_SYRINGE | Freq: Once | INTRAMUSCULAR | Status: AC
  Administered 2020-05-12: 0.5 mL via INTRAMUSCULAR
  Filled 2020-05-12: qty 0.5
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
## 2020-05-12 NOTE — Discharge Instructions (Addendum)
You were given Silvadene cream here in the emergency department.  You can apply this to the burn daily.  It can cause discoloration so if you would rather not use it you should use another antibiotic ointment.  -Prescription sent to the pharmacy for Percocet.  This is a narcotic pain medicine uses for severe pain only.  Do not drive or work when taking this as it can make you drowsy. Take it with food so it does not cause upset stomach. You can otherwise take ibuprofen for pain.  Make sure to continue to move your finger around so that it does not get stiff.  Follow-up with primary care doctor for reassessment if needed.  Return to the emergency department if symptoms worsen.
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
## 2020-05-18 IMAGING — US US ABDOMEN LIMITED
1 series · 14 of 25 positions shown · non-contrast
Comparison: None.

CLINICAL DATA: Right upper quadrant pain for 6 days.

EXAM:
ULTRASOUND ABDOMEN LIMITED RIGHT UPPER QUADRANT

[Series 1: us abdomen limited · 0.14mm/px · 14 of 52 slices shown]
[im 1/52]
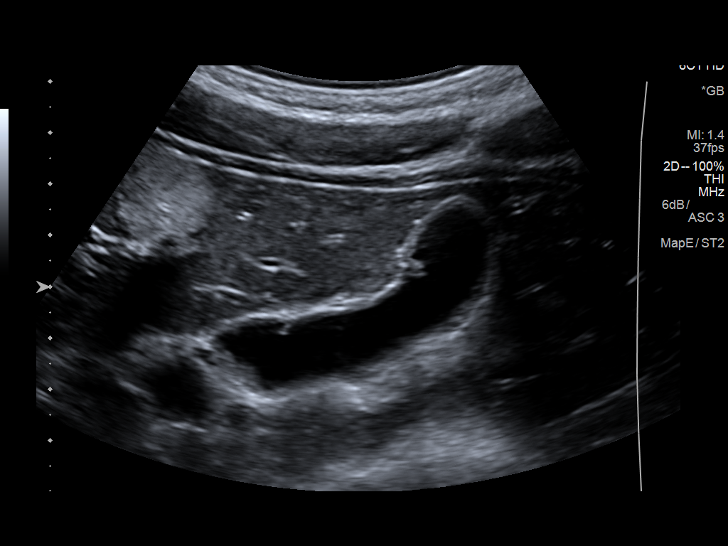
[im 5/52]
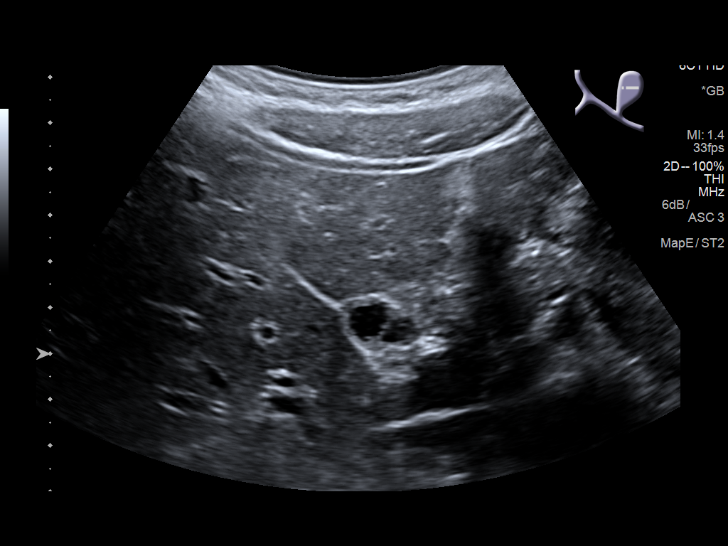
[im 9/52]
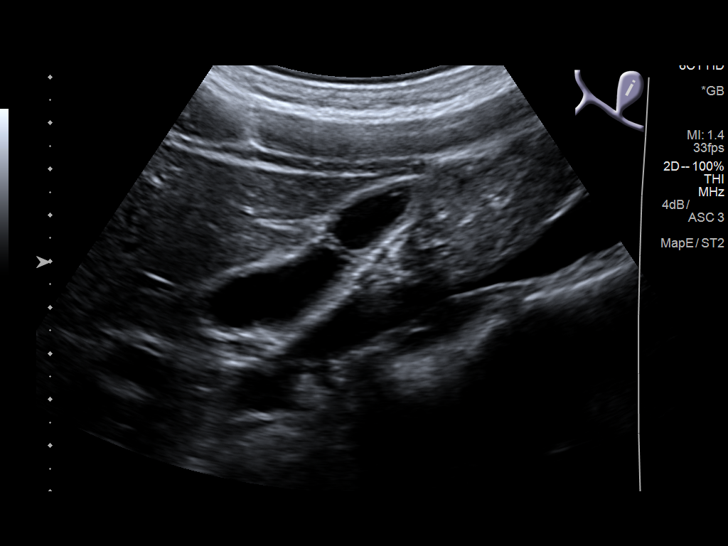
[im 13/52]
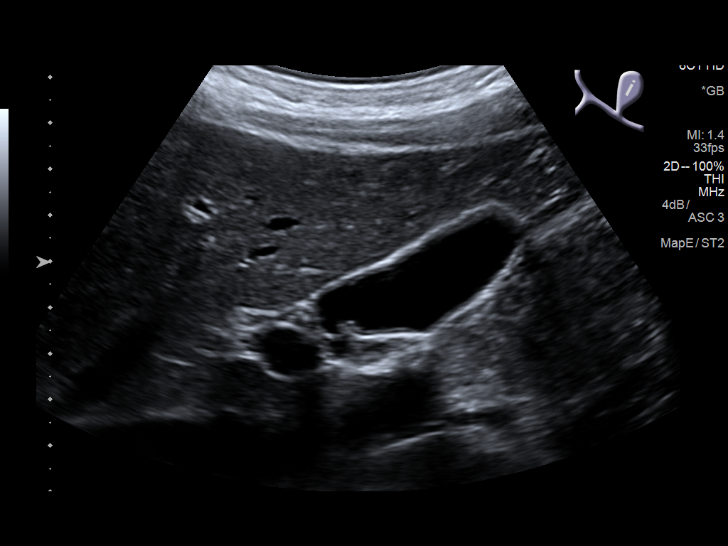
[im 18/52]
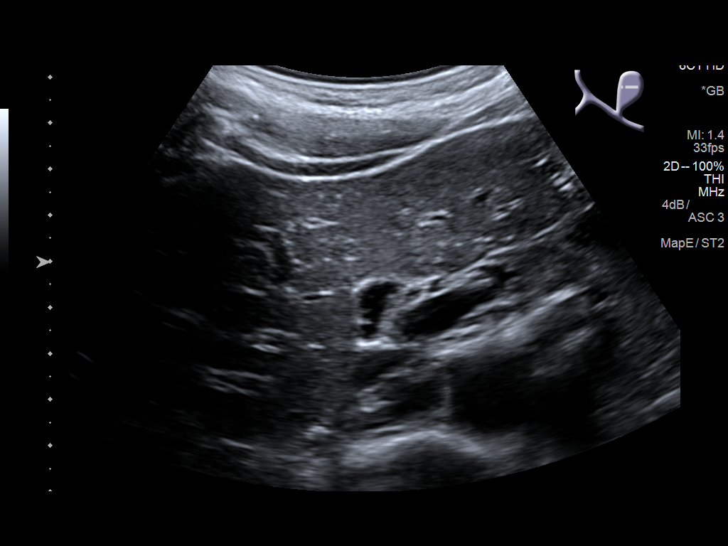
[im 20/52]
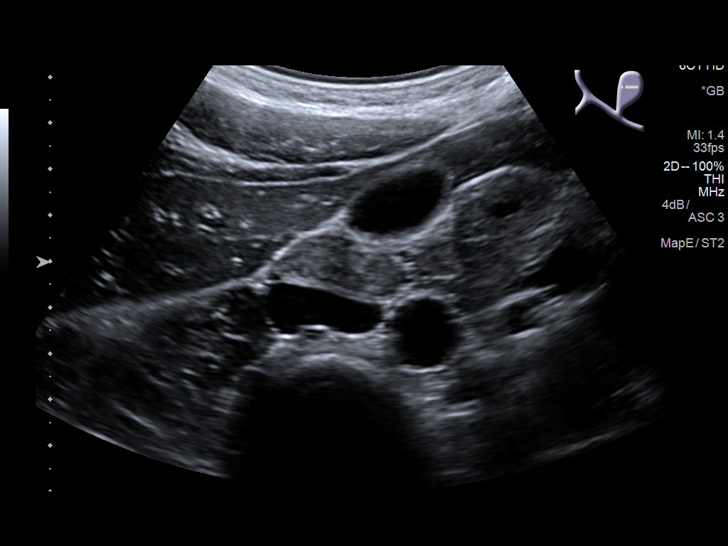
[im 24/52]
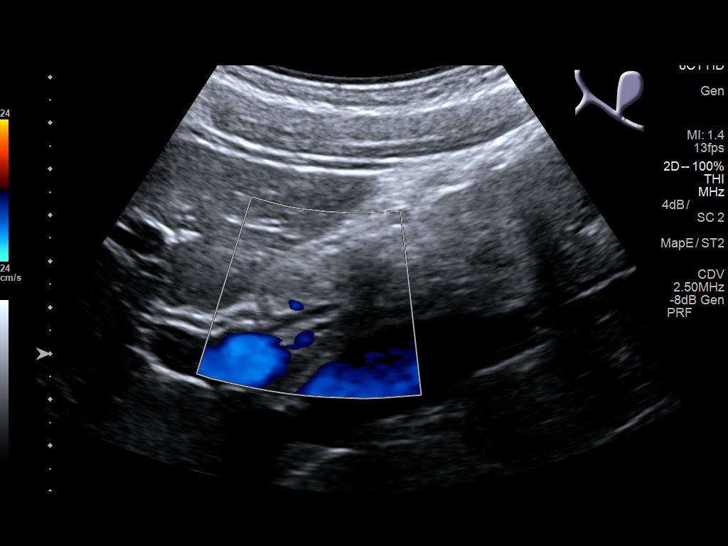
[im 28/52]
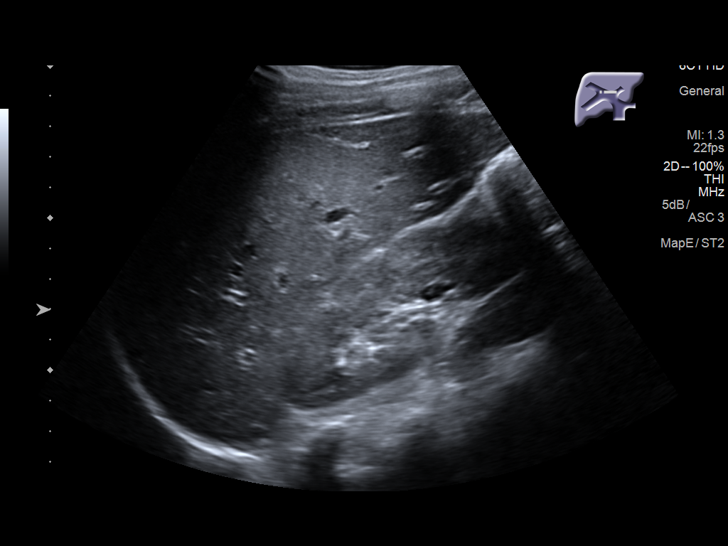
[im 32/52]
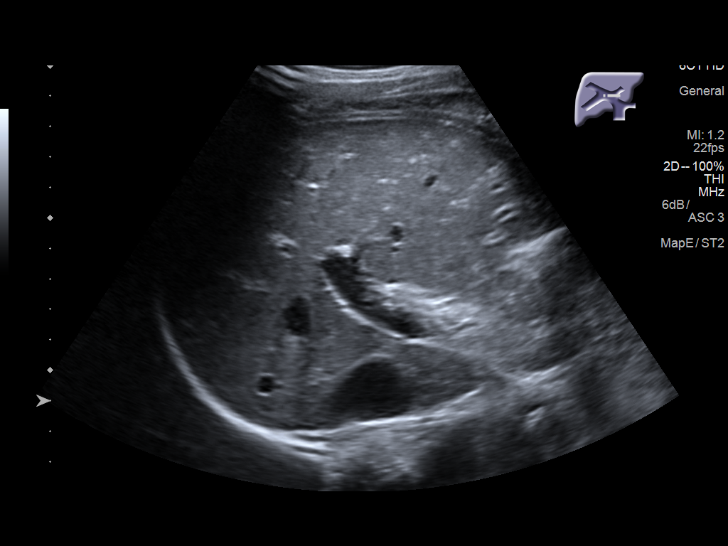
[im 35/52]
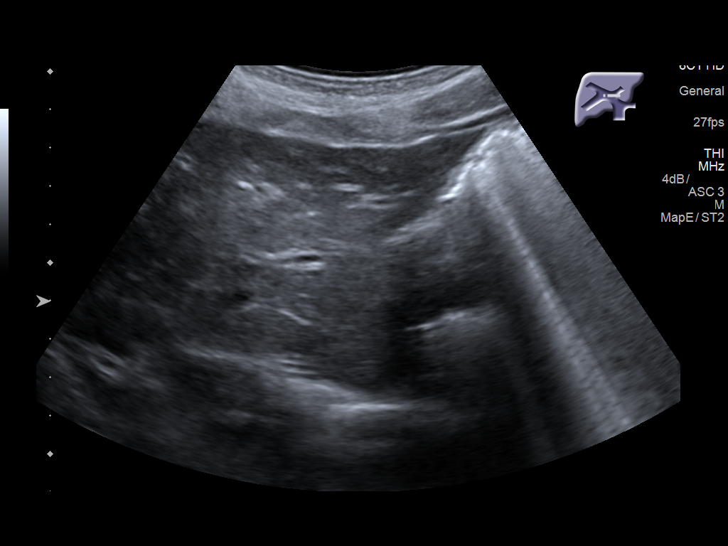
[im 39/52]
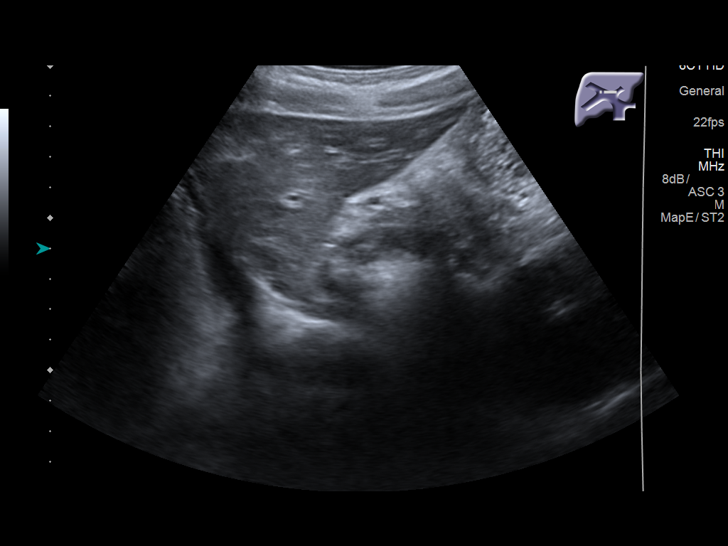
[im 43/52]
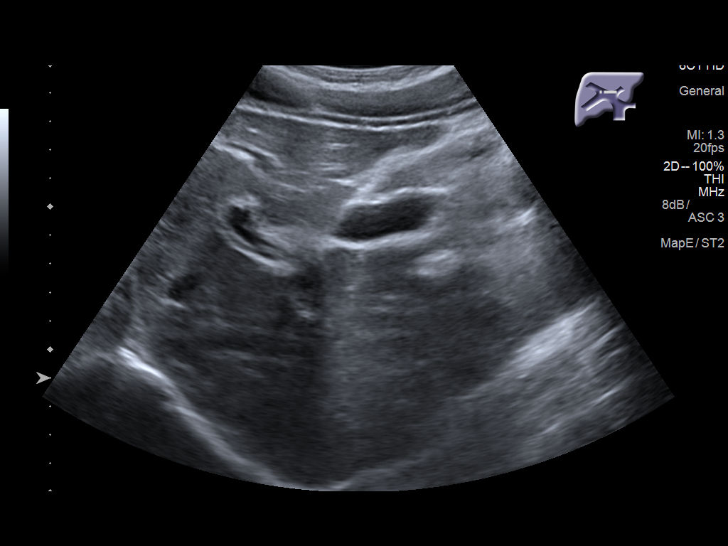
[im 47/52]
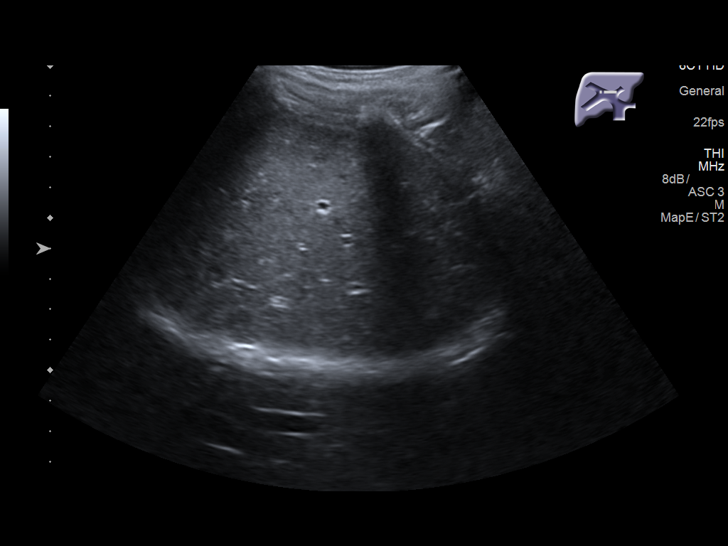
[im 52/52]
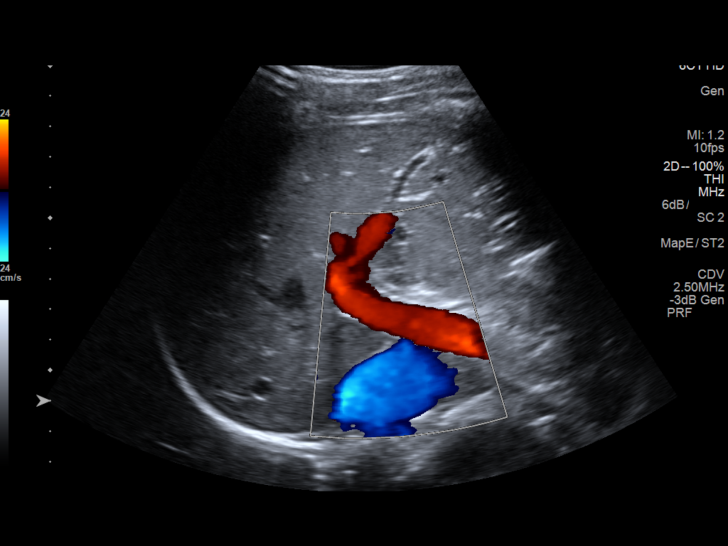

[14 of 25 positions shown; findings below may reference images not displayed]

FINDINGS: Gallbladder:

No gallstones or wall thickening visualized. No sonographic Murphy
sign noted by sonographer.

Common bile duct:

Diameter: 0.3 cm

Liver:

No focal lesion identified. Within normal limits in parenchymal
echogenicity. Portal vein is patent on color Doppler imaging with
normal direction of blood flow towards the liver.
IMPRESSION: Negative for gallstones.  Normal exam.

## 2020-05-22 MED FILL — BIKTARVY 50-200-25 MG TABS: 50-200-25 | 30 days supply | Qty: 30 | Fill #1

## 2020-07-01 ENCOUNTER — Other Ambulatory Visit: Payer: Self-pay

## 2020-07-01 ENCOUNTER — Encounter (HOSPITAL_COMMUNITY): Payer: Self-pay

## 2020-07-01 DIAGNOSIS — Z5321 Procedure and treatment not carried out due to patient leaving prior to being seen by health care provider: Secondary | ICD-10-CM | POA: Diagnosis not present

## 2020-07-01 DIAGNOSIS — R1110 Vomiting, unspecified: Secondary | ICD-10-CM | POA: Insufficient documentation

## 2020-07-01 MED ORDER — ONDANSETRON 4 MG PO TBDP
4.0000 mg | ORAL_TABLET | Freq: Once | ORAL | Status: AC | PRN
  Administered 2020-07-01: 4 mg via ORAL
  Filled 2020-07-01: qty 1

## 2020-07-01 NOTE — ED Notes (Signed)
Patient was called to reassess vital signs but no response. x1 

## 2020-07-01 NOTE — ED Triage Notes (Signed)
Pt reports vomiting all day.

## 2020-07-02 ENCOUNTER — Emergency Department (HOSPITAL_COMMUNITY)
Admission: EM | Admit: 2020-07-02 | Discharge: 2020-07-02 | Disposition: A | Discharge status: Home / Self Care | Payer: BLUE CROSS/BLUE SHIELD | Attending: Emergency Medicine | Admitting: Emergency Medicine

## 2020-07-02 NOTE — ED Notes (Signed)
Pt called 3x for room placement. Eloped from waiting area.  

## 2020-07-09 MED FILL — BIKTARVY 50-200-25 MG TABS: 50-200-25 | 30 days supply | Qty: 30 | Fill #2

## 2020-08-05 MED FILL — BIKTARVY 50-200-25 MG TABS: 50-200-25 | 30 days supply | Qty: 30 | Fill #3

## 2020-08-08 ENCOUNTER — Encounter: Payer: Self-pay | Admitting: Family

## 2020-08-08 ENCOUNTER — Ambulatory Visit (INDEPENDENT_AMBULATORY_CARE_PROVIDER_SITE_OTHER): Payer: BLUE CROSS/BLUE SHIELD | Admitting: Family

## 2020-08-08 ENCOUNTER — Other Ambulatory Visit: Payer: Self-pay | Admitting: Family

## 2020-08-08 ENCOUNTER — Other Ambulatory Visit: Payer: Self-pay

## 2020-08-08 VITALS — BP 111/69 | HR 88 | Temp 97.9°F | Ht 69.0 in | Wt 174.0 lb

## 2020-08-08 DIAGNOSIS — Z23 Encounter for immunization: Secondary | ICD-10-CM

## 2020-08-08 DIAGNOSIS — B20 Human immunodeficiency virus [HIV] disease: Secondary | ICD-10-CM

## 2020-08-08 DIAGNOSIS — Z0000 Encounter for general adult medical examination without abnormal findings: Secondary | ICD-10-CM

## 2020-08-08 MED ORDER — BIKTARVY 50-200-25 MG PO TABS: 1.0000 | ORAL_TABLET | Freq: Every day | ORAL | 5 refills | Status: DC

## 2020-08-08 NOTE — Progress Notes (Unsigned)
Subjective:    Patient ID: Matthew Petersen, male    DOB: 05-11-2001, 20 y.o.   MRN: [id number]  Chief Complaint  Patient presents with  . Follow-up    No to flu     HPI:  Matthew Petersen is a 20 y.o. male with HIV disease last seen on 04/24/2020 with well-controlled virus and good adherence and tolerance to his ART regimen of Biktarvy.  Viral load at the time was undetectable with CD4 count of 775.  Here today for routine follow-up.  Matthew Petersen continues to take his Matthew Petersen daily as prescribed with no adverse side effects and has missed approximately 3 doses of medication since his last office visit.  Overall feeling well today with no new concerns/complaints. Denies fevers, chills, night sweats, headaches, changes in vision, neck pain/stiffness, nausea, diarrhea, vomiting, lesions or rashes.  Mr. Matthew Petersen has no problems obtaining his medication from the pharmacy.  Denies feelings of being down, depressed, or hopeless recently.  No recreational or illicit drug use.  Tobacco and alcohol use is on occasional/social.  He continues to consider the Covid vaccine.  Healthcare maintenance due includes Menveo.  Declines condoms.   Allergies  Allergen Reactions  . Haldol [Haloperidol]     Dystonic reaction    Outpatient Medications Prior to Visit  Medication Sig Dispense Refill  . bictegravir-emtricitabine-tenofovir AF (BIKTARVY) 50-200-25 MG TABS tablet Take 1 tablet by mouth daily. 30 tablet 3  . hydrOXYzine (ATARAX/VISTARIL) 25 MG tablet Take 1 tablet (25 mg total) by mouth 3 (three) times daily as needed for itching. (Patient not taking: Reported on 08/08/2020) 60 tablet 0  . ondansetron (ZOFRAN) 4 MG tablet Take 1 tablet (4 mg total) by mouth every 8 (eight) hours as needed for up to 20 doses for nausea or vomiting. (Patient not taking: Reported on 08/08/2020) 20 tablet 0  . oxyCODONE-acetaminophen (PERCOCET/ROXICET) 5-325 MG tablet Take 2 tablets by mouth every 6 (six) hours as needed  for severe pain. (Patient not taking: Reported on 08/08/2020) 8 tablet 0   No facility-administered medications prior to visit.     Past Medical History:  Diagnosis Date  . HIV (human immunodeficiency virus infection) (HCC)      History reviewed. No pertinent surgical history.     Review of Systems  Constitutional: Negative for appetite change, chills, fatigue, fever and unexpected weight change.  Eyes: Negative for visual disturbance.  Respiratory: Negative for cough, chest tightness, shortness of breath and wheezing.   Cardiovascular: Negative for chest pain and leg swelling.  Gastrointestinal: Negative for abdominal pain, constipation, diarrhea, nausea and vomiting.  Genitourinary: Negative for dysuria, flank pain, frequency, genital sores, hematuria and urgency.  Skin: Negative for rash.  Allergic/Immunologic: Negative for immunocompromised state.  Neurological: Negative for dizziness and headaches.      Objective:    BP 111/69   Pulse 88   Temp 97.9 F (36.6 C) (Oral)   Ht 5\' 9"  (1.753 m)   Wt 174 lb (78.9 kg)   BMI 25.70 kg/m  Nursing note and vital signs reviewed.  Physical Exam Constitutional:      General: He is not in acute distress.    Appearance: He is well-developed.  HENT:     Mouth/Throat:     Mouth: Oropharynx is clear and moist.  Eyes:     Conjunctiva/sclera: Conjunctivae normal.  Cardiovascular:     Rate and Rhythm: Normal rate and regular rhythm.     Pulses: Intact distal pulses.  Heart sounds: Normal heart sounds. No murmur heard. No friction rub. No gallop.   Pulmonary:     Effort: Pulmonary effort is normal. No respiratory distress.     Breath sounds: Normal breath sounds. No wheezing or rales.  Chest:     Chest wall: No tenderness.  Abdominal:     General: Bowel sounds are normal.     Palpations: Abdomen is soft.     Tenderness: There is no abdominal tenderness.  Musculoskeletal:     Cervical back: Neck supple.   Lymphadenopathy:     Cervical: No cervical adenopathy.  Skin:    General: Skin is warm and dry.     Findings: No rash.  Neurological:     Mental Status: He is alert and oriented to person, place, and time.  Psychiatric:        Mood and Affect: Mood and affect normal.        Behavior: Behavior normal.        Thought Content: Thought content normal.        Judgment: Judgment normal.      Depression screen Matthew Petersen 2/9 04/24/2020 11/22/2018 09/08/2018 08/10/2018 07/19/2018  Decreased Interest 0 0 0 0 0  Down, Depressed, Hopeless 0 0 0 0 0  PHQ - 2 Score 0 0 0 0 0  Altered sleeping - - - - -  Tired, decreased energy - - - - -  Change in appetite - - - - -  Feeling bad or failure about yourself  - - - - -  Trouble concentrating - - - - -  Moving slowly or fidgety/restless - - - - -  Suicidal thoughts - - - - -  PHQ-9 Score - - - - -  Difficult doing work/chores - - - - -       Assessment & Plan:    Patient Active Problem List   Diagnosis Date Noted  . Grief 04/24/2020  . Contact dermatitis and eczema due to plant 11/17/2018  . Cannabis hyperemesis syndrome concurrent with and due to cannabis abuse (HCC) 09/08/2018  . Healthcare maintenance 09/08/2018  . Marijuana use 08/10/2018  . HIV disease (HCC) 07/21/2018  . Erectile dysfunction 07/19/2018  . Intractable vomiting 02/22/2018  . Mood disorder of depressed type 12/29/2017  . Loss of weight 12/29/2017  . Exposure to STD 12/22/2017     Problem List Items Addressed This Visit      Other   HIV disease [facility] Petersen) - Primary    Matthew Petersen continues to have well-controlled HIV disease with good adherence and tolerance to his ART regimen of Biktarvy.  No signs/symptoms of opportunistic infection or prog date.  Ressive HIV disease.  We reviewed previous lab work and discussed plan of care.  Check blood work today.  Continue current dose of Biktarvy.  Plan for follow-up in 4 months or sooner if needed with lab work on the same       Relevant Medications   bictegravir-emtricitabine-tenofovir AF (BIKTARVY) 50-200-25 MG TABS tablet   Other Relevant Orders   COMPLETE METABOLIC PANEL WITH GFR   HIV-1 RNA quant-no reflex-bld   T-helper cell (CD4)- ([facility] clinic only)   Healthcare maintenance     Discussed importance of safe sexual practice to reduce risk of STI.  Condoms declined.  Routine dental care up-to-date per recommendations.  Menveo updated today.  Discussed/recommended Covid vaccine.          I have discontinued Dayron T. Nieland's hydrOXYzine, ondansetron, and oxyCODONE-acetaminophen. I  am also having him maintain his Biktarvy.   Meds ordered this encounter  Medications  . bictegravir-emtricitabine-tenofovir AF (BIKTARVY) 50-200-25 MG TABS tablet    Sig: Take 1 tablet by mouth daily.    Dispense:  30 tablet    Refill:  5    Order Specific Question:   Supervising Provider    Answer:   Judyann Munson [[id number]]     Follow-up: Return in about 4 months (around 12/06/2020), or if symptoms worsen or fail to improve.   Marcos Eke, MSN, FNP-C Nurse Practitioner [facility] for Infectious Disease [facility] Main number: [phone]

## 2020-08-08 NOTE — Assessment & Plan Note (Signed)
Matthew Petersen continues to have well-controlled HIV disease with good adherence and tolerance to his ART regimen of Biktarvy.  No signs/symptoms of opportunistic infection or prog date.  Ressive HIV disease.  We reviewed previous lab work and discussed plan of care.  Check blood work today.  Continue current dose of Biktarvy.  Plan for follow-up in 4 months or sooner if needed with lab work on the same

## 2020-08-08 NOTE — Assessment & Plan Note (Signed)
   Discussed importance of safe sexual practice to reduce risk of STI.  Condoms declined.  Routine dental care up-to-date per recommendations.  Menveo updated today.  Discussed/recommended Covid vaccine.

## 2020-08-08 NOTE — Patient Instructions (Addendum)
Nice to see you.  We will check your lab work today.  Continue to take your Waggoner daily as prescribed.   Refills have been sent to the pharmacy.   Plan for follow up in 4 months or sooner if needed with lab work on the same day.  Have a great day and stay safe!

## 2020-08-09 LAB — T-HELPER CELL (CD4) - (RCID CLINIC ONLY)
CD4 % Helper T Cell: 33 % (ref 33–65)
CD4 T Cell Abs: 830 /uL (ref 400–1790)

## 2020-08-10 LAB — HIV-1 RNA QUANT-NO REFLEX-BLD
HIV 1 RNA Quant: <20 Copies/mL
HIV-1 RNA Quant, Log: <1.3 Log cps/mL

## 2020-08-10 LAB — COMPLETE METABOLIC PANEL WITH GFR
AG Ratio: 1.9 (calc) (ref 1.0–2.5)
ALT: 18 U/L (ref 9–46)
AST: 20 U/L (ref 10–40)
Albumin: 4.1 g/dL (ref 3.6–5.1)
Alkaline phosphatase (APISO): 61 U/L (ref 36–130)
BUN: 8 mg/dL (ref 7–25)
CO2: 27 mmol/L (ref 20–32)
Calcium: 9.3 mg/dL (ref 8.6–10.3)
Chloride: 108 mmol/L (ref 98–110)
Creat: 1.04 mg/dL (ref 0.60–1.35)
GFR, Est African American: 118 mL/min/{1.73_m2} (ref >=60)
GFR, Est Non African American: 102 mL/min/{1.73_m2} (ref >=60)
Globulin: 2.2 g/dL (calc) (ref 1.9–3.7)
Glucose, Bld: 101 mg/dL — ABNORMAL HIGH (ref 65–99)
Potassium: 4.6 mmol/L (ref 3.5–5.3)
Sodium: 141 mmol/L (ref 135–146)
Total Bilirubin: 0.4 mg/dL (ref 0.2–1.2)
Total Protein: 6.3 g/dL (ref 6.1–8.1)

## 2020-09-10 ENCOUNTER — Other Ambulatory Visit (HOSPITAL_COMMUNITY): Payer: Self-pay

## 2020-10-06 IMAGING — CT CT ABD-PELV W/ CM
2 of 4 series · 17 of 46 positions shown, 19 images · IV contrast (omnipaque)
Comparison: None.

CLINICAL DATA: Pt reports emesis x 3 days,generalized abd pain. Pt
recently started on antiviral medications.

EXAM:
CT ABDOMEN AND PELVIS WITH CONTRAST
TECHNIQUE: Multidetector CT imaging of the abdomen and pelvis was performed
using the standard protocol following bolus administration of
intravenous contrast.
CONTRAST:  100mL OMNIPAQUE IOHEXOL 300 MG/ML  SOLN

[Series 3: abd/ pelvis 5.0 i30f 2 · axial · 0.65mm/px · z∈[+895,+1315]mm · 14 of 93 slices shown, 16 images]
[im 5/93  soft-tissue]
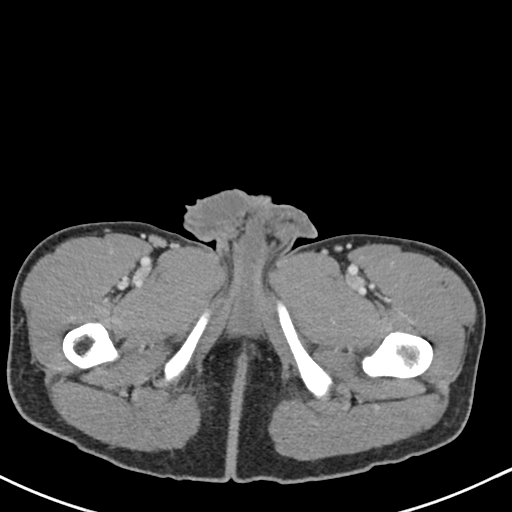
[im 5/93  bone]
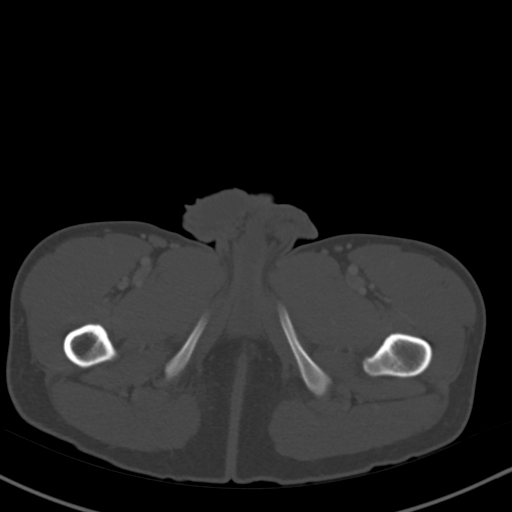
[im 13/93  soft-tissue]
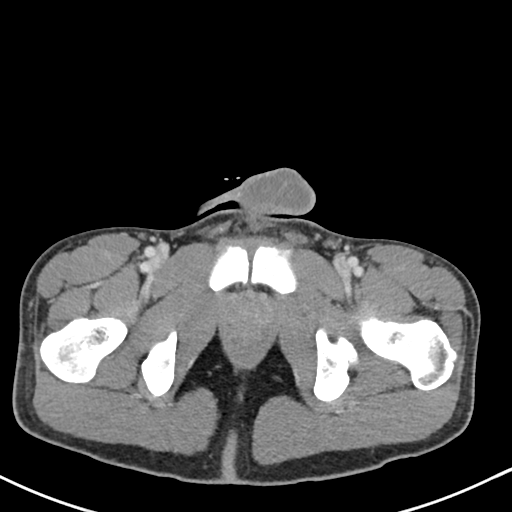
[im 17/93  soft-tissue]
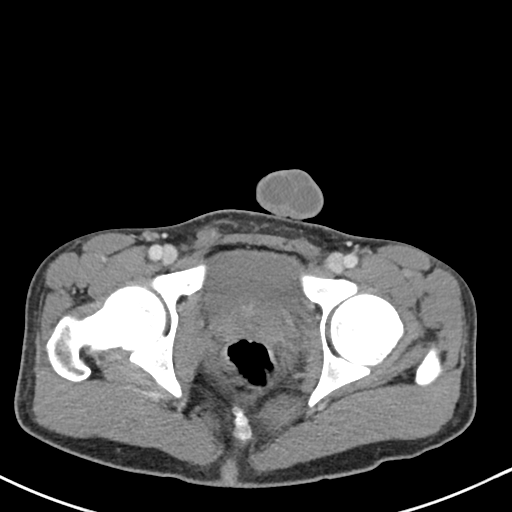
[im 25/93  soft-tissue]
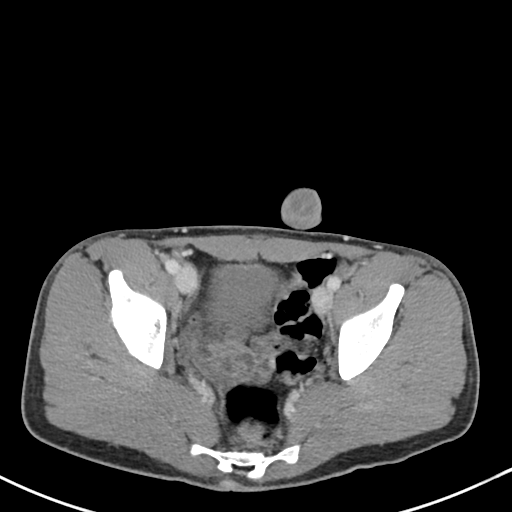
[im 33/93  soft-tissue]
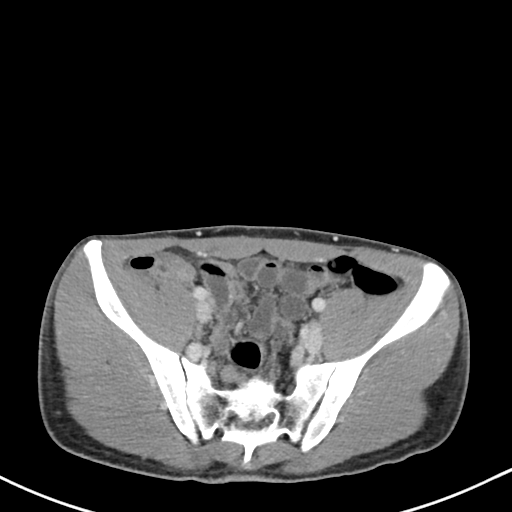
[im 37/93  soft-tissue]
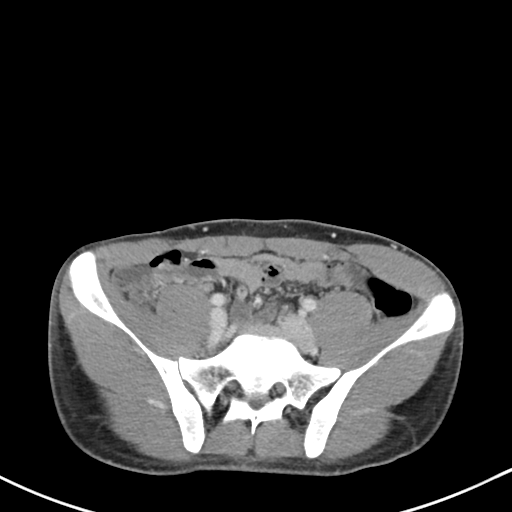
[im 45/93  soft-tissue]
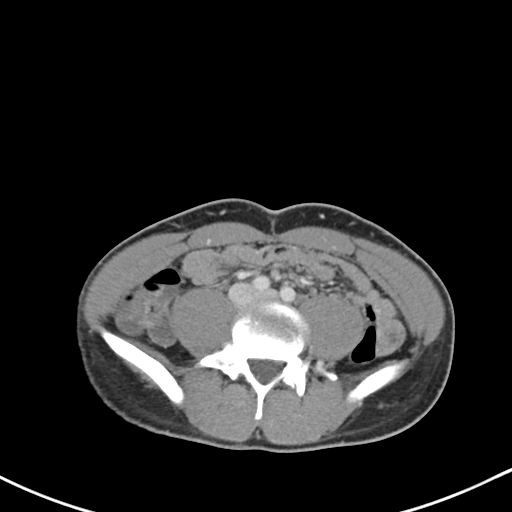
[im 49/93  soft-tissue]
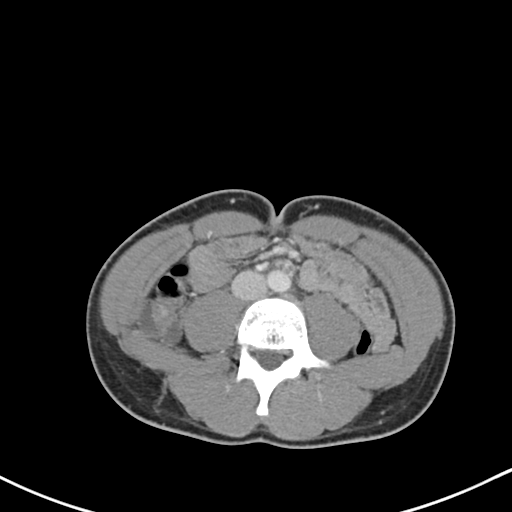
[im 57/93  soft-tissue]
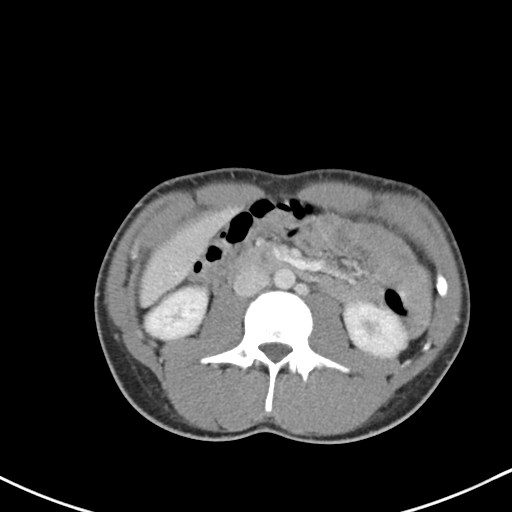
[im 57/93  bone]
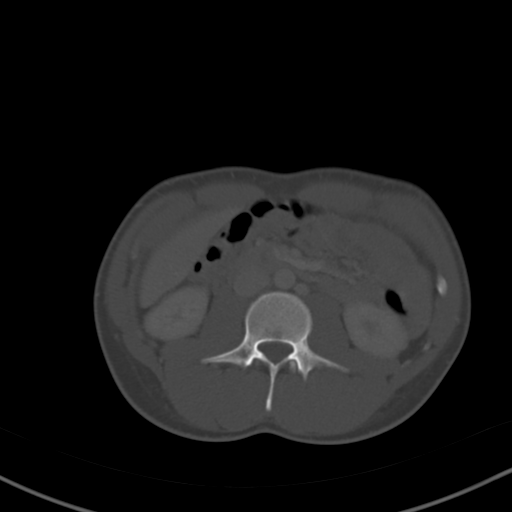
[im 61/93  soft-tissue]
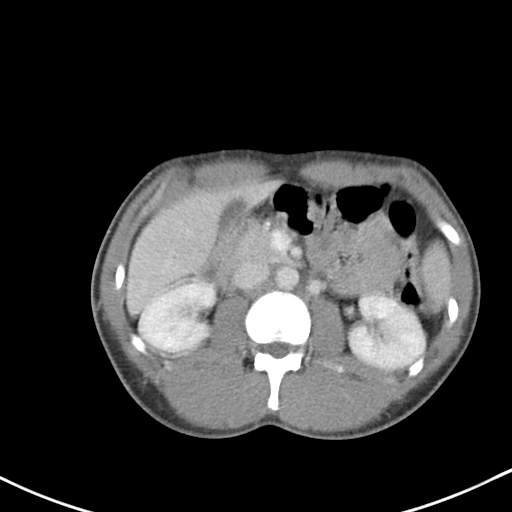
[im 69/93  soft-tissue]
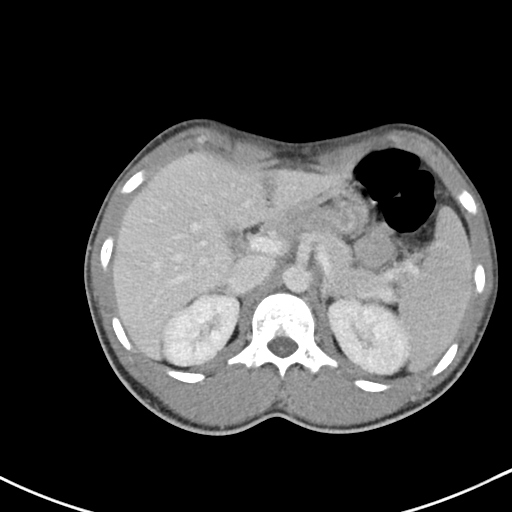
[im 77/93  soft-tissue]
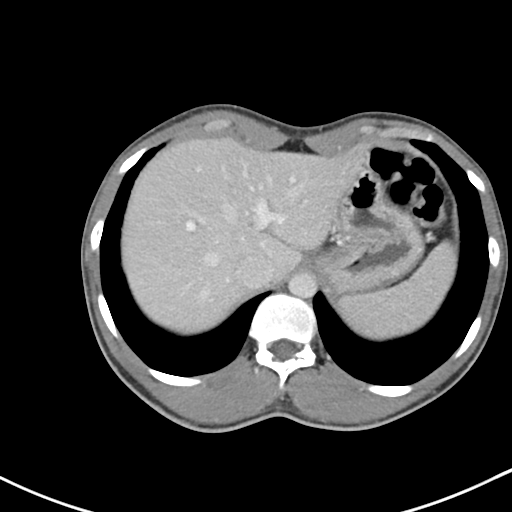
[im 81/93  soft-tissue]
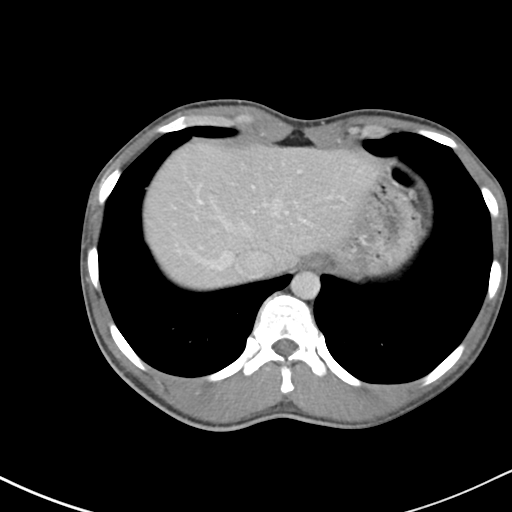
[im 89/93  soft-tissue]
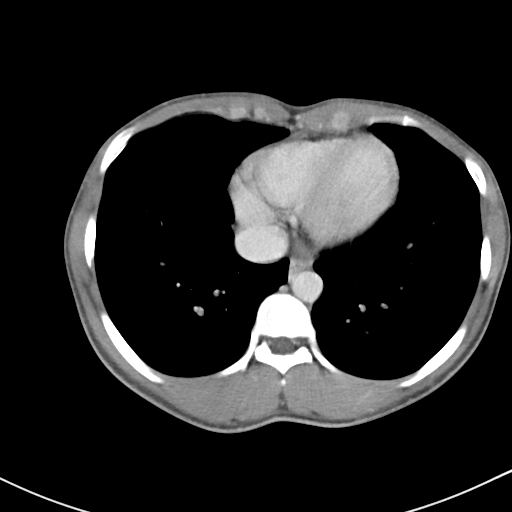

[Series 6: coronal soft tissue · coronal · 0.83mm/px · 3 of 101 slices shown]
[im 34/101  soft-tissue]
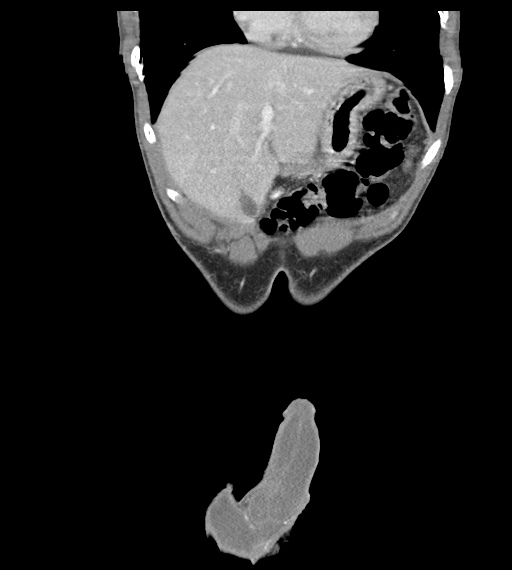
[im 45/101  soft-tissue]
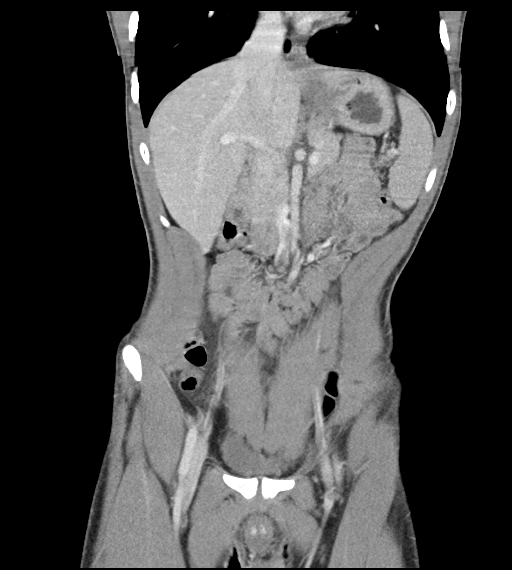
[im 56/101  soft-tissue]
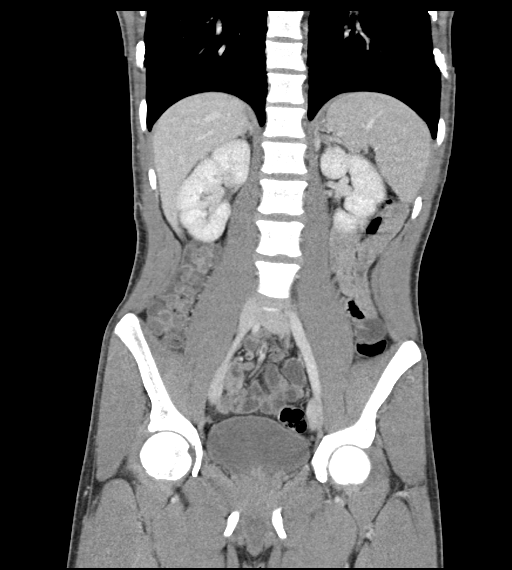

[17 of 46 positions shown; findings below may reference images not displayed]

FINDINGS: Lower chest: Clear lung bases.

Hepatobiliary: No focal liver abnormality is seen. No gallstones,
gallbladder wall thickening, or biliary dilatation.

Pancreas: Unremarkable. No pancreatic ductal dilatation or
surrounding inflammatory changes.

Spleen: Normal in size without focal abnormality.

Adrenals/Urinary Tract: Adrenal glands are unremarkable. Kidneys are
normal, without renal calculi, focal lesion, or hydronephrosis.
Bladder is unremarkable.

Stomach/Bowel: Stomach is unremarkable. Small bowel and colon are
normal in caliber. No wall thickening or inflammation.

Normal appendix is seen extending medially and inferiorly from the
cecal tip.

Vascular/Lymphatic: No significant vascular findings are present. No
enlarged abdominal or pelvic lymph nodes.

Reproductive: Unremarkable.

Other: No abdominal wall hernia or abnormality. No abdominopelvic
ascites.

Musculoskeletal: Normal.
IMPRESSION: 1. Normal enhanced CT scan of the abdomen and pelvis.

## 2020-10-09 ENCOUNTER — Other Ambulatory Visit (HOSPITAL_COMMUNITY): Payer: Self-pay

## 2020-10-09 MED FILL — Bictegravir-Emtricitabine-Tenofovir AF Tab 50-200-25 MG: ORAL | 30 days supply | Qty: 30 | Fill #0 | Status: AC

## 2020-10-11 ENCOUNTER — Other Ambulatory Visit (HOSPITAL_COMMUNITY): Payer: Self-pay

## 2020-11-06 ENCOUNTER — Other Ambulatory Visit (HOSPITAL_COMMUNITY): Payer: Self-pay

## 2020-11-22 ENCOUNTER — Other Ambulatory Visit (HOSPITAL_COMMUNITY): Payer: Self-pay

## 2020-11-22 MED FILL — Bictegravir-Emtricitabine-Tenofovir AF Tab 50-200-25 MG: ORAL | 30 days supply | Qty: 30 | Fill #1 | Status: AC

## 2020-12-05 ENCOUNTER — Ambulatory Visit: Payer: BLUE CROSS/BLUE SHIELD | Admitting: Family

## 2020-12-13 ENCOUNTER — Encounter (HOSPITAL_COMMUNITY): Payer: Self-pay

## 2020-12-13 ENCOUNTER — Emergency Department (HOSPITAL_COMMUNITY)
Admission: EM | Admit: 2020-12-13 | Discharge: 2020-12-13 | Disposition: A | Discharge status: Home / Self Care | Payer: BLUE CROSS/BLUE SHIELD | Attending: Emergency Medicine | Admitting: Emergency Medicine

## 2020-12-13 DIAGNOSIS — E876 Hypokalemia: Secondary | ICD-10-CM | POA: Insufficient documentation

## 2020-12-13 DIAGNOSIS — Z21 Asymptomatic human immunodeficiency virus [HIV] infection status: Secondary | ICD-10-CM | POA: Diagnosis not present

## 2020-12-13 DIAGNOSIS — R000 Tachycardia, unspecified: Secondary | ICD-10-CM | POA: Diagnosis not present

## 2020-12-13 DIAGNOSIS — E860 Dehydration: Secondary | ICD-10-CM | POA: Diagnosis not present

## 2020-12-13 DIAGNOSIS — R1110 Vomiting, unspecified: Secondary | ICD-10-CM | POA: Diagnosis present

## 2020-12-13 DIAGNOSIS — R112 Nausea with vomiting, unspecified: Secondary | ICD-10-CM

## 2020-12-13 LAB — COMPREHENSIVE METABOLIC PANEL
ALT: 21 U/L (ref 0–44)
AST: 24 U/L (ref 15–41)
Albumin: 4.9 g/dL (ref 3.5–5.0)
Alkaline Phosphatase: 59 U/L (ref 38–126)
Anion gap: 10 (ref 5–15)
BUN: 14 mg/dL (ref 6–20)
CO2: 28 mmol/L (ref 22–32)
Calcium: 9.7 mg/dL (ref 8.9–10.3)
Chloride: 98 mmol/L (ref 98–111)
Creatinine, Ser: 1.22 mg/dL (ref 0.61–1.24)
GFR, Estimated: >60 mL/min (ref >60)
Glucose, Bld: 96 mg/dL (ref 70–99)
Potassium: 2.8 mmol/L — ABNORMAL LOW (ref 3.5–5.1)
Sodium: 136 mmol/L (ref 135–145)
Total Bilirubin: 1.6 mg/dL — ABNORMAL HIGH (ref 0.3–1.2)
Total Protein: 8 g/dL (ref 6.5–8.1)

## 2020-12-13 LAB — URINALYSIS, ROUTINE W REFLEX MICROSCOPIC
Bacteria, UA: NONE SEEN
Bilirubin Urine: NEGATIVE
Glucose, UA: NEGATIVE mg/dL
Hgb urine dipstick: NEGATIVE
Ketones, ur: 80 mg/dL — AB
Nitrite: NEGATIVE
Protein, ur: 30 mg/dL — AB
Specific Gravity, Urine: 1.031 — ABNORMAL HIGH (ref 1.005–1.030)
pH: 6 (ref 5.0–8.0)

## 2020-12-13 LAB — CBC WITH DIFFERENTIAL/PLATELET
Abs Immature Granulocytes: 0.03 10*3/uL (ref 0.00–0.07)
Basophils Absolute: 0 10*3/uL (ref 0.0–0.1)
Basophils Relative: 0 %
Eosinophils Absolute: 0 10*3/uL (ref 0.0–0.5)
Eosinophils Relative: 0 %
HCT: 46.9 % (ref 39.0–52.0)
Hemoglobin: 15.8 g/dL (ref 13.0–17.0)
Immature Granulocytes: 1 %
Lymphocytes Relative: 57 %
Lymphs Abs: 3 10*3/uL (ref 0.7–4.0)
MCH: 26.5 pg (ref 26.0–34.0)
MCHC: 33.7 g/dL (ref 30.0–36.0)
MCV: 78.7 fL — ABNORMAL LOW (ref 80.0–100.0)
Monocytes Absolute: 0.5 10*3/uL (ref 0.1–1.0)
Monocytes Relative: 10 %
Neutro Abs: 1.7 10*3/uL (ref 1.7–7.7)
Neutrophils Relative %: 32 %
Platelets: 231 10*3/uL (ref 150–400)
RBC: 5.96 MIL/uL — ABNORMAL HIGH (ref 4.22–5.81)
RDW: 12.1 % (ref 11.5–15.5)
WBC: 5.4 10*3/uL (ref 4.0–10.5)
nRBC: 0 % (ref 0.0–0.2)

## 2020-12-13 LAB — LIPASE, BLOOD: Lipase: 28 U/L (ref 11–51)

## 2020-12-13 MED ORDER — FAMOTIDINE 20 MG PO TABS: 20.0000 mg | ORAL_TABLET | Freq: Two times a day (BID) | ORAL | 0 refills | Status: DC

## 2020-12-13 MED ORDER — ONDANSETRON 4 MG PO TBDP: 4.0000 mg | ORAL_TABLET | Freq: Three times a day (TID) | ORAL | 0 refills | Status: DC | PRN

## 2020-12-13 MED ORDER — ONDANSETRON HCL 4 MG/2ML IJ SOLN
4.0000 mg | Freq: Once | INTRAMUSCULAR | Status: AC
  Administered 2020-12-13: 4 mg via INTRAVENOUS
  Filled 2020-12-13: qty 2

## 2020-12-13 MED ORDER — POTASSIUM CHLORIDE 10 MEQ/100ML IV SOLN
10.0000 meq | INTRAVENOUS | Status: AC
  Administered 2020-12-13 (×2): 10 meq via INTRAVENOUS
  Filled 2020-12-13 (×2): qty 100

## 2020-12-13 MED ORDER — SODIUM CHLORIDE 0.9 % IV BOLUS
1000.0000 mL | Freq: Once | INTRAVENOUS | Status: AC
  Administered 2020-12-13: 1000 mL via INTRAVENOUS

## 2020-12-13 MED ORDER — PANTOPRAZOLE SODIUM 40 MG IV SOLR
40.0000 mg | Freq: Once | INTRAVENOUS | Status: AC
  Administered 2020-12-13: 40 mg via INTRAVENOUS
  Filled 2020-12-13: qty 40

## 2020-12-13 NOTE — ED Provider Notes (Signed)
Emergency Medicine Provider Triage Evaluation Note  Matthew Petersen , a 20 y.o. male  was evaluated in triage.  Pt complains of vomiting for 4 days.  Reports abdominal pain and tried using icy hot to help with pain.  Has not had a bowel movement in 2 weeks.  Also concerns about some bumps in his anal area.  Review of Systems  Positive: Abdominal pain, vomiting Negative: diarrhea  Physical Exam  BP 127/86 (BP Location: Left Arm)   Pulse (!) 130   Temp 97.8 F (36.6 C) (Oral)   Resp 18   SpO2 100%  Gen:   Awake, no distress   Resp:  Normal effort  MSK:   Moves extremities without difficulty  Other:  TTP of abdomen  Medical Decision Making  Medically screening exam initiated at 1:25 PM.  Appropriate orders placed.  Matthew Petersen was informed that the remainder of the evaluation will be completed by another provider, this initial triage assessment does not replace that evaluation, and the importance of remaining in the ED until their evaluation is complete.  Will order lab work   Matthew Pates, PA-C 12/13/20 1327    Matthew Kaplan, MD 12/13/20 1725

## 2020-12-13 NOTE — Discharge Instructions (Signed)
Please read and follow all provided instructions.  Your diagnoses today include:  1. Non-intractable vomiting with nausea, unspecified vomiting type   2. Hypokalemia   3. Dehydration     TTests performed today include: Blood cell counts and platelets - showed low potassium Kidney and liver function tests Pancreas function test (called lipase) Urine test to look for infection - showed dehydration Vital signs. See below for your results today.   Medications prescribed:  Zofran (ondansetron) - for nausea and vomiting  Pepcid (famotidine) - antihistamine  You can find this medication over-the-counter.   DO NOT exceed:  20mg  Pepcid every 12 hours  Take any prescribed medications only as directed.  Home care instructions:  Follow any educational materials contained in this packet.  You should rest for the next several days. Keep drinking plenty of fluids and use the medicine for nausea as directed.   Drink clear liquids for the next 24 hours and introduce solid foods slowly after 24 hours using the b.r.a.t. diet (Bananas, Rice, Applesauce, Toast, Yogurt).    Follow-up instructions: Please follow-up with your primary care provider in the next 3 days for further evaluation of your symptoms. If you are not feeling better in 48 hours you may have a condition that is more serious and you need re-evaluation.   You would also likely benefit from seeing GI for further evaluation of your symptoms. Referral attached.   Return instructions:  SEEK IMMEDIATE MEDICAL ATTENTION IF: If you have pain that does not go away or becomes severe  A temperature above 101F develops  Repeated vomiting occurs (multiple episodes)  If you have pain that becomes localized to portions of the abdomen. The right side could possibly be appendicitis. In an adult, the left lower portion of the abdomen could be colitis or diverticulitis.  Blood is being passed in stools or vomit (bright red or black tarry stools)   You develop chest pain, difficulty breathing, dizziness or fainting, or become confused, poorly responsive, or inconsolable (young children) If you have any other emergent concerns regarding your health  Additional Information: Abdominal (belly) pain can be caused by many things. Your caregiver performed an examination and possibly ordered blood/urine tests and imaging (CT scan, x-rays, ultrasound). Many cases can be observed and treated at home after initial evaluation in the emergency department. Even though you are being discharged home, abdominal pain can be unpredictable. Therefore, you need a repeated exam if your pain does not resolve, returns, or worsens. Most patients with abdominal pain don't have to be admitted to the hospital or have surgery, but serious problems like appendicitis and gallbladder attacks can start out as nonspecific pain. Many abdominal conditions cannot be diagnosed in one visit, so follow-up evaluations are very important.  Your vital signs today were: BP 109/72   Pulse 85   Temp 99.1 F (37.3 C) (Oral)   Resp 17   SpO2 99%  If your blood pressure (bp) was elevated above 135/85 this visit, please have this repeated by your doctor within one month. --------------

## 2020-12-13 NOTE — ED Provider Notes (Signed)
[facility]-EMERGENCY DEPT Provider Note   CSN: [id number] Arrival date & time: 12/13/20  1306     History Chief Complaint  Patient presents with   Emesis    Matthew Petersen is a 20 y.o. male.  Patient with history of HIV with good counts --presents the emergency department today for evaluation of persistent vomiting over the past 2 weeks.  Patient states that he has been having difficulty keeping down any solids or liquids, however has been able to tolerate sips of Gatorade at times.  He denies fever or chills.  No chest pain or shortness of breath.  Vomit is nonbilious, nonbloody.  He does not have nausea medications at home but does report applying icy hot which has caused a rash on his abdomen.  He has had similar episodes in the past, thought to be due to cannabinoid hyperemesis, and he still uses marijuana but states that he has "cut back".  No abdominal pain at current time.  Denies heavy NSAID or alcohol use.  He has been referred to GI in the past but has never had an EGD or other work-up as an outpatient.  The onset of this condition was acute. The course is constant. Aggravating factors: none. Alleviating factors: none.        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 No pertinent surgical history.     Family History  Problem Relation Age of Onset   Diabetes Mother    Multiple sclerosis Father      Social History   Tobacco Use   Smoking status: Never   Smokeless tobacco: Never  Vaping Use   Vaping Use: Never used  Substance Use Topics   Alcohol use: Not Currently   Drug use: Yes    Frequency: 7.0 times per week    Types: Marijuana    Home Medications Prior to Admission medications   Medication Sig Start Date End Date Taking? Authorizing Provider  bictegravir-emtricitabine-tenofovir AF (BIKTARVY) 50-200-25 MG TABS tablet TAKE 1 TABLET BY MOUTH DAILY. 08/08/20 08/08/21  Veryl Speak, FNP    Allergies    Haldol [haloperidol]  Review of Systems   Review of Systems  Constitutional:  Negative for appetite change and fever.  HENT:  Negative for rhinorrhea and sore throat.   Eyes:  Negative for redness.  Respiratory:  Negative for cough.   Cardiovascular:  Negative for chest pain.  Gastrointestinal:  Positive for nausea and vomiting. Negative for abdominal pain, blood in stool and diarrhea.       Negative for hematemesis  Genitourinary:  Negative for dysuria.  Musculoskeletal:  Negative for myalgias.  Skin:  Negative for rash.  Neurological:  Negative for light-headedness.   Physical Exam Updated Vital Signs BP 127/86 (BP Location: Left Arm)   Pulse (!) 130   Temp 97.8 F (36.6 C) (Oral)   Resp 18   SpO2 100%   Physical Exam  Vitals and nursing note reviewed.  Constitutional:      General: He is not in acute distress.    Appearance: He is well-developed.  HENT:     Head: Normocephalic and atraumatic.     Mouth/Throat:     Mouth: Mucous membranes are moist.     Comments: No vomiting, occasionally spitting into an emesis bag. Eyes:     General:        Right eye: No discharge.        Left eye: No discharge.     Conjunctiva/sclera: Conjunctivae normal.  Cardiovascular:     Rate and Rhythm: Regular rhythm. Tachycardia present.     Heart sounds: Normal heart sounds.  Pulmonary:     Effort: Pulmonary effort is normal.     Breath sounds: Normal breath  sounds.  Abdominal:     Palpations: Abdomen is soft.     Tenderness: There is no abdominal tenderness.  Musculoskeletal:     Cervical back: Normal range of motion and neck supple.  Skin:    General: Skin is warm and dry.     Comments: There is an area of hyperpigmentation over the left middle abdomen where patient had applied icy hot.  Neurological:     Mental Status: He is alert.    ED Results / Procedures / Treatments   Labs (all labs ordered are listed, but only abnormal results are displayed) Labs Reviewed  COMPREHENSIVE METABOLIC PANEL - Abnormal; Notable for the following components:      Result Value   Potassium 2.8 (*)    Total Bilirubin 1.6 (*)    All other components within normal limits  CBC WITH DIFFERENTIAL/PLATELET - Abnormal; Notable for the following components:   RBC 5.96 (*)    MCV 78.7 (*)    All other components within normal limits  LIPASE, BLOOD    EKG EKG Interpretation  Date/Time:  Friday December 13 2020 17:53:03 EDT Ventricular Rate:  85 PR Interval:  150 QRS Duration: 114 QT Interval:  412 QTC Calculation: 490 R Axis:   75 Text Interpretation: Normal sinus rhythm with sinus arrhythmia Prolonged QT Abnormal ECG Confirmed by Alona Bene [phone]) on 12/13/2020 6:02:39 PM  Radiology No results found.  Procedures Procedures   Medications Ordered in ED Medications  sodium chloride 0.9 % bolus 1,000 mL (has no administration in time range)  ondansetron (ZOFRAN) injection 4 mg (has no administration in time range)  pantoprazole (PROTONIX) injection 40 mg (has no administration in time range)  potassium chloride 10 mEq in 100 mL IVPB (has no administration in time range)    ED Course  I have reviewed the triage vital signs and the nursing notes.  Pertinent labs & imaging results that were available during my care of the patient were reviewed by me and considered in my medical decision making (see chart for details).  Patient seen and examined.  Work-up initiated. Medications ordered.  Patient is hypokalemic and will need repletion.  He will also be given IV fluids due to tachycardia.  Will check EKG.  At time of exam, he actually looks fairly comfortable.  No abdominal tenderness.  Vital signs reviewed and are as follows: BP 127/86 (BP Location: Left Arm)   Pulse (!) 130   Temp 97.8 F (36.6 C) (Oral)   Resp 18   SpO2 100%   6:50 PM UA reviewed, concentrated. Additional liter of fluid ordered. EKG reviewed with Dr. Jacqulyn Bath.   8:19 PM Pt rechecked, doing well. He  feels comfortable with d/c home when fluids completed. Last 500cc running.  Will give rx: zofran, pepcid, GI referral for home.   9:14 PM fluids completed.  Will discharge with plan as above.  The patient was urged to return to the Emergency Department immediately with worsening of current symptoms, worsening abdominal pain, persistent vomiting, blood noted in stools, fever, or any other concerns. The patient verbalized understanding.     MDM Rules/Calculators/A&P                          N/V: Episodic, ongoing over the past couple of weeks.  In setting of ongoing, but decreased THC use.  Abdomen is soft and nontender.  HIV however not significantly immunocompromised.  Feel he would benefit from GI evaluation as he has not had a work-up by them in the past.  Symptoms controlled after IV hydration, antiemetics.  Hypokalemia: No significant EKG changes.  Repleted over a couple of hours in the ED.  Should have this rechecked.  Final Clinical Impression(s) / ED Diagnoses Final diagnoses:  Non-intractable vomiting with nausea, unspecified vomiting type  Hypokalemia  Dehydration    Rx / DC Orders ED Discharge Orders          Ordered    ondansetron (ZOFRAN ODT) 4 MG disintegrating tablet  Every 8 hours PRN        12/13/20 2111    famotidine (PEPCID) 20 MG tablet  2 times daily        12/13/20 2111             Renne Crigler, Cordelia Poche 12/13/20 2116    Long,  Arlyss Repress, MD 12/13/20 [id number]

## 2020-12-13 NOTE — ED Triage Notes (Signed)
Pt arrived via walk in, c/o vomiting, and rash on buttocks x4 days. Also complaining of constipation. Denies any urinary problems but states urine has been dark.

## 2020-12-13 NOTE — ED Notes (Signed)
Pt was able to drink 240cc.  RN aware.

## 2020-12-17 ENCOUNTER — Other Ambulatory Visit (HOSPITAL_COMMUNITY): Payer: Self-pay

## 2020-12-25 ENCOUNTER — Other Ambulatory Visit (HOSPITAL_COMMUNITY): Payer: Self-pay

## 2021-01-10 ENCOUNTER — Other Ambulatory Visit (HOSPITAL_COMMUNITY): Payer: Self-pay

## 2021-03-06 ENCOUNTER — Telehealth: Payer: Self-pay | Admitting: Pharmacist

## 2021-03-06 ENCOUNTER — Other Ambulatory Visit (HOSPITAL_COMMUNITY): Payer: Self-pay

## 2021-03-06 MED FILL — Bictegravir-Emtricitabine-Tenofovir AF Tab 50-200-25 MG: ORAL | 30 days supply | Qty: 30 | Fill #2 | Status: AC

## 2021-03-06 NOTE — Telephone Encounter (Signed)
Called patient today since he had no HIV follow-up at [facility] and had not picked up his medicines in over 2 months from [facility]. He stated the last 2 months have been rough and he has not felt his best. He has not taken his Biktarvy for 2 months. He said he actually refilled it yesterday and is picking it up today. Scheduled follow-up with Tammy Sours at the beginning of October.  Margarite Gouge, PharmD, CPP Clinical Pharmacist Practitioner Infectious Diseases Clinical Pharmacist [facility] for Infectious Disease

## 2021-03-17 ENCOUNTER — Ambulatory Visit (INDEPENDENT_AMBULATORY_CARE_PROVIDER_SITE_OTHER): Payer: BLUE CROSS/BLUE SHIELD | Admitting: Family

## 2021-03-17 ENCOUNTER — Other Ambulatory Visit (HOSPITAL_COMMUNITY): Payer: Self-pay

## 2021-03-17 ENCOUNTER — Encounter: Payer: Self-pay | Admitting: Family

## 2021-03-17 ENCOUNTER — Other Ambulatory Visit: Payer: Self-pay

## 2021-03-17 VITALS — BP 121/73 | HR 96 | Temp 98.0°F | Wt 182.0 lb

## 2021-03-17 DIAGNOSIS — Z79899 Other long term (current) drug therapy: Secondary | ICD-10-CM | POA: Diagnosis not present

## 2021-03-17 DIAGNOSIS — B20 Human immunodeficiency virus [HIV] disease: Secondary | ICD-10-CM | POA: Diagnosis not present

## 2021-03-17 DIAGNOSIS — Z0000 Encounter for general adult medical examination without abnormal findings: Secondary | ICD-10-CM

## 2021-03-17 MED ORDER — BICTEGRAVIR-EMTRICITAB-TENOFOV 50-200-25 MG PO TABS
1.0000 | ORAL_TABLET | Freq: Every day | ORAL | 5 refills | Status: AC
  Filled 2021-03-17: qty 30, 30d supply, fill #0

## 2021-03-17 NOTE — Patient Instructions (Signed)
Nice to see you.  We will check your lab work today.  Refills have been sent to the pharmacy.  Plan for follow up in 2 months or sooner if needed with lab work on the same day.  Have a great day and stay safe!

## 2021-03-17 NOTE — Progress Notes (Signed)
Brief Narrative   Patient ID: Matthew Petersen, male    DOB: 01/08/2001, 20 y.o.   MRN: [id number]  Matthew Petersen is a 20 y/o AA male diagnosed with HIV disease in February 2020 with risk factor of MSM. Genotype with no significant resistance patterns. Initial CD4 nadir of 760 with viral load of 139,000. Entered care in CDC Stage 1. Matthew Petersen negative. No history of opportunistic infection. Sole medication regimen of Biktarvy.   Subjective:    Chief Complaint  Patient presents with   Follow-up    B20    HPI:  Matthew Petersen is a 20 y.o. male with HIV disease last seen 04/07/2021 with well-controlled virus and good adherence and tolerance to his ART regimen of Biktarvy.  Viral load at the time was undetectable with CD4 count of 830.  Here today for routine follow-up.  Matthew Petersen has had less than optimal adherence to his ART regimen having been off his medication for approximately 3 months and restarted taking his medications about 2 weeks ago secondary to being sick.  Overall feeling well today with no new concerns/complaints. Denies fevers, chills, night sweats, headaches, changes in vision, neck pain/stiffness, nausea, diarrhea, vomiting, lesions or rashes.  Matthew Petersen has no problems obtaining medication from the pharmacy and remains covered through [facility].  Denies feelings of being down, depressed, or hopeless recently.  Continues to smoke marijuana daily with no alcohol or tobacco use.  Condoms offered and declined.  Due for routine dental care.  Declines vaccines.  Allergies  Allergen Reactions   Haldol [Haloperidol]     Dystonic reaction      Outpatient Medications Prior to Visit  Medication Sig Dispense Refill   bictegravir-emtricitabine-tenofovir AF (BIKTARVY) 50-200-25 MG TABS tablet TAKE 1 TABLET BY MOUTH DAILY. 30 tablet 5   famotidine (PEPCID) 20 MG tablet Take 1 tablet (20 mg total) by mouth 2 (two) times daily. (Patient not taking: Reported on  03/17/2021) 30 tablet 0   ondansetron (ZOFRAN ODT) 4 MG disintegrating tablet Take 1 tablet (4 mg total) by mouth every 8 (eight) hours as needed for nausea or vomiting. (Patient not taking: Reported on 03/17/2021) 10 tablet 0   No facility-administered medications prior to visit.     Past Medical History:  Diagnosis Date   HIV (human immunodeficiency virus infection) (HCC)      History reviewed. No pertinent surgical history.    Review of Systems  Constitutional:  Negative for appetite change, chills, fatigue, fever and unexpected weight change.  Eyes:  Negative for visual disturbance.  Respiratory:  Negative for cough, chest tightness, shortness of breath and wheezing.   Cardiovascular:  Negative for chest pain and leg swelling.  Gastrointestinal:  Negative for abdominal pain, constipation, diarrhea, nausea and vomiting.  Genitourinary:  Negative for dysuria, flank pain, frequency, genital sores, hematuria and urgency.  Skin:  Negative for rash.  Allergic/Immunologic: Negative for immunocompromised state.  Neurological:  Negative for dizziness and headaches.     Objective:    BP 121/73   Pulse 96   Temp 98 F (36.7 C) (Oral)   Wt 182 lb (82.6 kg)   BMI 26.88 kg/m  Nursing note and vital signs reviewed.  Physical Exam Constitutional:      General: He is not in acute distress.    Appearance: He is well-developed.     Comments: Seated in the chair; pleasant.  Strong smell of marijuana  Eyes:     Conjunctiva/sclera: Conjunctivae normal.  Cardiovascular:     Rate and Rhythm: Normal rate and regular rhythm.     Heart sounds: Normal heart sounds. No murmur heard.   No friction rub. No gallop.  Pulmonary:     Effort: Pulmonary effort is normal. No respiratory distress.     Breath sounds: Normal breath sounds. No wheezing or rales.  Chest:     Chest wall: No tenderness.  Abdominal:     General: Bowel sounds are normal.     Palpations: Abdomen is soft.     Tenderness:  There is no abdominal tenderness.  Musculoskeletal:     Cervical back: Neck supple.  Lymphadenopathy:     Cervical: No cervical adenopathy.  Skin:    General: Skin is warm and dry.     Findings: No rash.  Neurological:     Mental Status: He is alert and oriented to person, place, and time.  Psychiatric:        Behavior: Behavior normal.        Thought Content: Thought content normal.        Judgment: Judgment normal.     Depression screen [facility] 2/9 03/17/2021 04/24/2020 11/22/2018 09/08/2018 08/10/2018  Decreased Interest 0 0 0 0 0  Down, Depressed, Hopeless 0 0 0 0 0  PHQ - 2 Score 0 0 0 0 0  Altered sleeping - - - - -  Tired, decreased energy - - - - -  Change in appetite - - - - -  Feeling bad or failure about yourself  - - - - -  Trouble concentrating - - - - -  Moving slowly or fidgety/restless - - - - -  Suicidal thoughts - - - - -  PHQ-9 Score - - - - -  Difficult doing work/chores - - - - -       Assessment & Plan:    Patient Active Problem List   Diagnosis Date Noted   Grief 04/24/2020   Contact dermatitis and eczema due to plant 11/17/2018   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  Discussed importance of taking medication daily as prescribed.  No signs/symptoms of opportunistic infection or progressive HIV.  Continue current dose of Biktarvy.  Check blood work today.  Plan for follow-up in 2 months or sooner if needed  with lab work on the same day.      Relevant Medications   bictegravir-emtricitabine-tenofovir AF (BIKTARVY) 50-200-25 MG TABS tablet   Other Relevant Orders   COMPLETE METABOLIC PANEL WITH GFR   HIV-1 RNA quant-no reflex-bld   T-helper cell (CD4)- ([facility] clinic only)   Lipid Profile   Healthcare maintenance    Discussed importance of safe sexual practices and condom usage.  Condoms declined. Due for routine dental care encouraged to be completed independently Declines vaccines.      Other Visit Diagnoses     Pharmacologic therapy  Relevant Orders   RPR        I have discontinued Hayven T. Labarge's ondansetron and famotidine. I am also having him maintain his bictegravir-emtricitabine-tenofovir AF.   Meds ordered this encounter  Medications   bictegravir-emtricitabine-tenofovir AF (BIKTARVY) 50-200-25 MG TABS tablet    Sig: Take 1 tablet by mouth daily.    Dispense:  30 tablet    Refill:  5    Order Specific Question:   Supervising Provider    Answer:   Judyann Munson [[id number]]     Follow-up: Return in about 2 months (around 05/17/2021), or if symptoms worsen or fail to improve.   Marcos Eke, MSN, FNP-C Nurse Practitioner [facility] for Infectious Disease [facility] Main number: [phone]

## 2021-03-17 NOTE — Assessment & Plan Note (Signed)
   Discussed importance of safe sexual practices and condom usage.  Condoms declined.  Due for routine dental care encouraged to be completed independently  Declines vaccines.

## 2021-03-17 NOTE — Assessment & Plan Note (Signed)
Matthew Petersen has less than optimal adherence with good tolerance to his ART regimen of Biktarvy having missed approximately 3 months of medication.  Discussed importance of taking medication daily as prescribed.  No signs/symptoms of opportunistic infection or progressive HIV.  Continue current dose of Biktarvy.  Check blood work today.  Plan for follow-up in 2 months or sooner if needed with lab work on the same day.

## 2021-03-19 LAB — HELPER T-LYMPH-CD4 (ARMC ONLY)
% CD 4 Pos. Lymph.: 31.1 % (ref 30.8–58.5)
Absolute CD 4 Helper: 964 /uL (ref 359–1519)
Basophils Absolute: 0 10*3/uL (ref 0.0–0.2)
Basos: 0 %
EOS (ABSOLUTE): 0 10*3/uL (ref 0.0–0.4)
Eos: 1 %
Hematocrit: 43.4 % (ref 37.5–51.0)
Hemoglobin: 13.6 g/dL (ref 13.0–17.7)
Immature Grans (Abs): 0 10*3/uL (ref 0.0–0.1)
Immature Granulocytes: 0 %
Lymphocytes Absolute: 3.1 10*3/uL (ref 0.7–3.1)
Lymphs: 65 %
MCH: 26.5 pg — ABNORMAL LOW (ref 26.6–33.0)
MCHC: 31.3 g/dL — ABNORMAL LOW (ref 31.5–35.7)
MCV: 84 fL (ref 79–97)
Monocytes Absolute: 0.3 10*3/uL (ref 0.1–0.9)
Monocytes: 7 %
Neutrophils Absolute: 1.3 10*3/uL — ABNORMAL LOW (ref 1.4–7.0)
Neutrophils: 27 %
Platelets: 201 10*3/uL (ref 150–450)
RBC: 5.14 x10E6/uL (ref 4.14–5.80)
RDW: 13.7 % (ref 11.6–15.4)
WBC: 4.7 10*3/uL (ref 3.4–10.8)

## 2021-03-19 LAB — COMPLETE METABOLIC PANEL WITH GFR
AG Ratio: 2.3 (calc) (ref 1.0–2.5)
ALT: 21 U/L (ref 9–46)
AST: 18 U/L (ref 10–40)
Albumin: 4.6 g/dL (ref 3.6–5.1)
Alkaline phosphatase (APISO): 59 U/L (ref 36–130)
BUN/Creatinine Ratio: 7 (calc) (ref 6–22)
BUN: 6 mg/dL — ABNORMAL LOW (ref 7–25)
CO2: 27 mmol/L (ref 20–32)
Calcium: 9.5 mg/dL (ref 8.6–10.3)
Chloride: 105 mmol/L (ref 98–110)
Creat: 0.84 mg/dL (ref 0.60–1.24)
Globulin: 2 g/dL (calc) (ref 1.9–3.7)
Glucose, Bld: 90 mg/dL (ref 65–99)
Potassium: 4.6 mmol/L (ref 3.5–5.3)
Sodium: 139 mmol/L (ref 135–146)
Total Bilirubin: 0.7 mg/dL (ref 0.2–1.2)
Total Protein: 6.6 g/dL (ref 6.1–8.1)
eGFR: 126 mL/min/{1.73_m2} (ref >=60)

## 2021-03-19 LAB — LIPID PANEL
Cholesterol: 199 mg/dL (ref <200)
HDL: 61 mg/dL (ref >=40)
LDL Cholesterol (Calc): 115 mg/dL (calc) — ABNORMAL HIGH
Non-HDL Cholesterol (Calc): 138 mg/dL (calc) — ABNORMAL HIGH (ref <130)
Total CHOL/HDL Ratio: 3.3 (calc) (ref <5.0)
Triglycerides: 122 mg/dL (ref <150)

## 2021-03-19 LAB — RPR: RPR Ser Ql: NONREACTIVE

## 2021-03-19 LAB — HIV-1 RNA QUANT-NO REFLEX-BLD
HIV 1 RNA Quant: 326 Copies/mL — ABNORMAL HIGH
HIV-1 RNA Quant, Log: 2.51 Log cps/mL — ABNORMAL HIGH

## 2021-04-07 ENCOUNTER — Other Ambulatory Visit (HOSPITAL_COMMUNITY): Payer: Self-pay

## 2021-05-19 ENCOUNTER — Ambulatory Visit: Payer: BLUE CROSS/BLUE SHIELD | Admitting: Family

## 2021-08-01 ENCOUNTER — Telehealth: Payer: Self-pay

## 2021-08-01 NOTE — Telephone Encounter (Signed)
Called patient to schedule overdue appointment, call could not be completed.  ° °Shabree Tebbetts D Kiren Mcisaac, RN ° °

## 2021-08-07 ENCOUNTER — Ambulatory Visit: Payer: BLUE CROSS/BLUE SHIELD | Admitting: Family

## 2021-09-10 ENCOUNTER — Other Ambulatory Visit (HOSPITAL_COMMUNITY): Payer: Self-pay

## 2021-11-07 ENCOUNTER — Telehealth: Payer: Self-pay

## 2021-11-07 NOTE — Telephone Encounter (Signed)
Patient called to schedule appointment for STI testing. Scheduled with Rexene Alberts, NP 11/11/21.  Patient asking for refills of Biktarvy, says he has been out for "months." Per chart, last fill was 02/2021. Advised that RN would need to reach out to provider for approval to restart. Patient verbalized understanding and has no further questions.   Sandie Ano, RN

## 2021-11-11 ENCOUNTER — Encounter: Payer: Self-pay | Admitting: Infectious Diseases

## 2021-11-11 ENCOUNTER — Ambulatory Visit (INDEPENDENT_AMBULATORY_CARE_PROVIDER_SITE_OTHER): Payer: BLUE CROSS/BLUE SHIELD | Admitting: Infectious Diseases

## 2021-11-11 ENCOUNTER — Other Ambulatory Visit: Payer: Self-pay

## 2021-11-11 VITALS — BP 119/87 | HR 63 | Temp 98.3°F | Ht 68.0 in | Wt 179.0 lb

## 2021-11-11 DIAGNOSIS — Z113 Encounter for screening for infections with a predominantly sexual mode of transmission: Secondary | ICD-10-CM | POA: Diagnosis not present

## 2021-11-11 DIAGNOSIS — A549 Gonococcal infection, unspecified: Secondary | ICD-10-CM | POA: Diagnosis not present

## 2021-11-11 DIAGNOSIS — B20 Human immunodeficiency virus [HIV] disease: Secondary | ICD-10-CM

## 2021-11-11 MED ORDER — BIKTARVY 50-200-25 MG PO TABS: 1.0000 | ORAL_TABLET | Freq: Every day | ORAL | 2 refills | Status: DC

## 2021-11-11 MED ORDER — CEFTRIAXONE SODIUM 500 MG IJ SOLR
500.0000 mg | Freq: Once | INTRAMUSCULAR | Status: AC
  Administered 2021-11-11: 500 mg via INTRAMUSCULAR

## 2021-11-11 NOTE — Progress Notes (Signed)
Subjective:    CC: Matthew Petersen is a 21 y.o. male patient here today with concern over STI exposure/symptoms .    HPI: States he had a new male partner, last sexual encounter > 2 weeks ago. Oral contact only. No symptoms but knows that partner had to have treatment for GC/CT.  He has also been off his biktarvy for several months (last in September 2022). Prescription ran out and he has had some trouble with depression at that time. Spent time discussing this today. Did space out biktarvy briefly to stretch medications out a bit.    Review of Systems: Review of Systems  Constitutional:  Negative for chills and fever.  HENT:  Negative for sore throat.   Eyes:  Negative for visual disturbance.  Gastrointestinal:  Negative for abdominal pain, anal bleeding and rectal pain.  Genitourinary:  Negative for dysuria, genital sores, penile discharge, penile pain, scrotal swelling and testicular pain.  Musculoskeletal:  Negative for arthralgias and joint swelling.  Skin:  Negative for rash.  Neurological:  Negative for headaches.  Hematological:  Negative for adenopathy.   Sexual History:  Prefers male partners, oral sex only Condom use none   Past Medical History:  Diagnosis Date   HIV (human immunodeficiency virus infection) (HCC)     No outpatient medications prior to visit.   No facility-administered medications prior to visit.    Allergies  Allergen Reactions   Haldol [Haloperidol]     Dystonic reaction    Family History  Problem Relation Age of Onset   Diabetes Mother    Multiple sclerosis Father         Objective:  Objective  Vitals:   11/11/21 1128  BP: 119/87  Pulse: 63  Temp: 98.3 F (36.8 C)  SpO2: 98%   Body mass index is 27.22 kg/m.   Physical Exam  Physical Exam Constitutional:      Appearance: Normal appearance. He is not ill-appearing.  HENT:     Head: Normocephalic.     Mouth/Throat:     Mouth: Mucous membranes are moist.      Pharynx: Oropharynx is clear.  Eyes:     General: No scleral icterus. Cardiovascular:     Rate and Rhythm: Normal rate and regular rhythm.  Pulmonary:     Effort: Pulmonary effort is normal.  Musculoskeletal:        General: Normal range of motion.     Cervical back: Normal range of motion.  Skin:    Coloration: Skin is not jaundiced or pale.  Neurological:     Mental Status: He is alert and oriented to person, place, and time.  Psychiatric:        Mood and Affect: Mood normal.        Judgment: Judgment normal.         Assessment & Plan:    Problem List Items Addressed This Visit       Unprioritized   HIV disease (HCC) - Primary    Rogen has been off his medication for several months due to worsening depression and expired script. We spent time discussing HIV natural progression today - last CD4 > 900 54m ago and I explained I expect this to still be preserved. Counseled to never space out pills as it can lead to acquired treatment resistance.  Resume Biktarvy once daily and have him back in 8 weeks to re-assess viral load response with Tammy Sours.        Relevant  Medications   bictegravir-emtricitabine-tenofovir AF (BIKTARVY) 50-200-25 MG TABS tablet   Other Relevant Orders   HIV 1 RNA quant-no reflex-bld   T-helper cell (CD4)- ([facility] clinic only)   Screening for STDs (sexually transmitted diseases)    Treated empirically with 500 mg IM ceftriaxone after d/w him. Will follow oral and urine swabs. RPR as well for syphilis screening. He has been abstaining from penetrative sex given off ARVs.         Relevant Orders   Cytology (oral, anal, urethral) ancillary only   Urine cytology ancillary only   RPR   Other Visit Diagnoses     Gonorrhea       Relevant Medications   bictegravir-emtricitabine-tenofovir AF (BIKTARVY) 50-200-25 MG TABS tablet   cefTRIAXone (ROCEPHIN) injection 500 mg (Completed)        Rexene Alberts, MSN, NP-C [facility] Office: [phone] Pager: [phone]

## 2021-11-11 NOTE — Assessment & Plan Note (Signed)
Treated empirically with 500 mg IM ceftriaxone after d/w him. Will follow oral and urine swabs. RPR as well for syphilis screening. He has been abstaining from penetrative sex given off ARVs.

## 2021-11-11 NOTE — Patient Instructions (Addendum)
We gave you treatment today with an antibiotic in the muscle.  Will let you know what your labs show as they come in   Please pick up your Biktarvy prescription and start taking 1 tablet once a day like you were.  Best to continue daily and not "space out" the mediation. Think of it as all or nothing so to say (of course we always want you on it!)  Please come back to see Tammy Sours in 6-8 weeks so we can repeat your viral load and make sure your medication is working well for you and we don't need to consider a switch.

## 2021-11-11 NOTE — Assessment & Plan Note (Addendum)
Matthew Petersen has been off his medication for several months due to worsening depression and expired script. We spent time discussing HIV natural progression today - last CD4 > 900 60m ago and I explained I expect this to still be preserved. Counseled to never space out pills as it can lead to acquired treatment resistance.  Resume Biktarvy once daily and have him back in 8 weeks to re-assess viral load response with Tammy Sours.

## 2021-11-12 LAB — CYTOLOGY, (ORAL, ANAL, URETHRAL) ANCILLARY ONLY
Chlamydia: NEGATIVE
Comment: NEGATIVE
Comment: NORMAL
Neisseria Gonorrhea: POSITIVE — AB

## 2021-11-12 LAB — URINE CYTOLOGY ANCILLARY ONLY
Chlamydia: NEGATIVE
Comment: NEGATIVE
Comment: NORMAL
Neisseria Gonorrhea: NEGATIVE

## 2021-11-13 LAB — T-HELPER CELL (CD4) - (RCID CLINIC ONLY)
CD4 % Helper T Cell: 35 % (ref 33–65)
CD4 T Cell Abs: 675 /uL (ref 400–1790)

## 2021-11-14 LAB — HIV-1 RNA QUANT-NO REFLEX-BLD
HIV 1 RNA Quant: 43 copies/mL — ABNORMAL HIGH
HIV-1 RNA Quant, Log: 1.63 Log copies/mL — ABNORMAL HIGH

## 2021-11-14 LAB — RPR: RPR Ser Ql: NONREACTIVE

## 2022-01-12 ENCOUNTER — Ambulatory Visit: Payer: BLUE CROSS/BLUE SHIELD | Admitting: Infectious Diseases

## 2022-01-12 ENCOUNTER — Telehealth: Payer: Self-pay

## 2022-01-12 NOTE — Telephone Encounter (Signed)
Attempted to call patient to reschedule missed appointment. Tried both mobile and home numbers. Call could not be completed as dialed for mobile, and home number was not in service.  Will send MyChart message.   Wyvonne Lenz, RN

## 2022-03-19 ENCOUNTER — Other Ambulatory Visit: Payer: Self-pay | Admitting: Family

## 2022-03-19 ENCOUNTER — Other Ambulatory Visit (HOSPITAL_COMMUNITY): Payer: Self-pay

## 2022-03-19 DIAGNOSIS — B20 Human immunodeficiency virus [HIV] disease: Secondary | ICD-10-CM

## 2022-04-10 ENCOUNTER — Other Ambulatory Visit (HOSPITAL_COMMUNITY): Payer: Self-pay

## 2022-06-01 ENCOUNTER — Other Ambulatory Visit (HOSPITAL_COMMUNITY): Payer: Self-pay

## 2022-06-01 ENCOUNTER — Telehealth: Payer: Self-pay

## 2022-06-01 DIAGNOSIS — B20 Human immunodeficiency virus [HIV] disease: Secondary | ICD-10-CM

## 2022-06-01 MED ORDER — BIKTARVY 50-200-25 MG PO TABS
1.0000 | ORAL_TABLET | Freq: Every day | ORAL | 0 refills | Status: DC
  Filled 2022-06-01 – 2022-06-16 (×3): qty 30, 30d supply, fill #0

## 2022-06-01 NOTE — Telephone Encounter (Signed)
Patient called to schedule appointment and request refills. Scheduled 12/29 with Marcos Eke, NP.   Sandie Ano, RN

## 2022-06-01 NOTE — Addendum Note (Signed)
Addended by: Linna Hoff D on: 06/01/2022 09:34 AM   Modules accepted: Orders

## 2022-06-01 NOTE — Telephone Encounter (Signed)
Ok for 30 days 

## 2022-06-03 ENCOUNTER — Other Ambulatory Visit (HOSPITAL_COMMUNITY): Payer: Self-pay

## 2022-06-05 ENCOUNTER — Other Ambulatory Visit (HOSPITAL_COMMUNITY): Payer: Self-pay

## 2022-06-11 ENCOUNTER — Telehealth: Payer: Self-pay

## 2022-06-11 ENCOUNTER — Other Ambulatory Visit (HOSPITAL_COMMUNITY): Payer: Self-pay

## 2022-06-11 NOTE — Telephone Encounter (Signed)
[facility] Patient Advocate Encounter ? ?Insurance verification completed.   ? ?The patient is uninsured and will need patient assistance for medication. ? ?We can complete the application and will need to meet with the patient for signatures and income documentation. ? ?Rona Tomson, CPhT ?Specialty Pharmacy Patient Advocate ?[facility] ?Phone: [phone] ?Fax:  [phone]  ?

## 2022-06-12 ENCOUNTER — Ambulatory Visit: Payer: Self-pay | Admitting: Family

## 2022-06-16 ENCOUNTER — Other Ambulatory Visit (HOSPITAL_COMMUNITY): Payer: Self-pay

## 2022-06-17 ENCOUNTER — Emergency Department (HOSPITAL_COMMUNITY)
Admission: EM | Admit: 2022-06-17 | Discharge: 2022-06-17 | Discharge status: Against Medical Advice | Payer: Self-pay | Attending: Emergency Medicine | Admitting: Emergency Medicine

## 2022-06-17 ENCOUNTER — Emergency Department (HOSPITAL_BASED_OUTPATIENT_CLINIC_OR_DEPARTMENT_OTHER)
Admission: EM | Admit: 2022-06-17 | Discharge: 2022-06-18 | Discharge status: Against Medical Advice | Payer: Self-pay | Attending: Emergency Medicine | Admitting: Emergency Medicine

## 2022-06-17 ENCOUNTER — Encounter (HOSPITAL_BASED_OUTPATIENT_CLINIC_OR_DEPARTMENT_OTHER): Payer: Self-pay

## 2022-06-17 ENCOUNTER — Other Ambulatory Visit: Payer: Self-pay

## 2022-06-17 DIAGNOSIS — Z5321 Procedure and treatment not carried out due to patient leaving prior to being seen by health care provider: Secondary | ICD-10-CM | POA: Insufficient documentation

## 2022-06-17 DIAGNOSIS — R6883 Chills (without fever): Secondary | ICD-10-CM | POA: Insufficient documentation

## 2022-06-17 DIAGNOSIS — R197 Diarrhea, unspecified: Secondary | ICD-10-CM | POA: Insufficient documentation

## 2022-06-17 DIAGNOSIS — R112 Nausea with vomiting, unspecified: Secondary | ICD-10-CM | POA: Insufficient documentation

## 2022-06-17 DIAGNOSIS — R109 Unspecified abdominal pain: Secondary | ICD-10-CM | POA: Insufficient documentation

## 2022-06-17 LAB — CBC
HCT: 45.3 % (ref 39.0–52.0)
Hemoglobin: 14.6 g/dL (ref 13.0–17.0)
MCH: 25.9 pg — ABNORMAL LOW (ref 26.0–34.0)
MCHC: 32.2 g/dL (ref 30.0–36.0)
MCV: 80.5 fL (ref 80.0–100.0)
Platelets: 197 10*3/uL (ref 150–400)
RBC: 5.63 MIL/uL (ref 4.22–5.81)
RDW: 13.7 % (ref 11.5–15.5)
WBC: 11.1 10*3/uL — ABNORMAL HIGH (ref 4.0–10.5)
nRBC: 0 % (ref 0.0–0.2)

## 2022-06-17 LAB — COMPREHENSIVE METABOLIC PANEL
ALT: 27 U/L (ref 0–44)
AST: 28 U/L (ref 15–41)
Albumin: 5 g/dL (ref 3.5–5.0)
Alkaline Phosphatase: 53 U/L (ref 38–126)
Anion gap: 16 — ABNORMAL HIGH (ref 5–15)
BUN: 14 mg/dL (ref 6–20)
CO2: 21 mmol/L — ABNORMAL LOW (ref 22–32)
Calcium: 10.8 mg/dL — ABNORMAL HIGH (ref 8.9–10.3)
Chloride: 105 mmol/L (ref 98–111)
Creatinine, Ser: 1.15 mg/dL (ref 0.61–1.24)
GFR, Estimated: >60 mL/min (ref >60)
Glucose, Bld: 143 mg/dL — ABNORMAL HIGH (ref 70–99)
Potassium: 3.9 mmol/L (ref 3.5–5.1)
Sodium: 142 mmol/L (ref 135–145)
Total Bilirubin: 0.7 mg/dL (ref 0.3–1.2)
Total Protein: 8.9 g/dL — ABNORMAL HIGH (ref 6.5–8.1)

## 2022-06-17 LAB — LIPASE, BLOOD: Lipase: <10 U/L — ABNORMAL LOW (ref 11–51)

## 2022-06-17 NOTE — ED Triage Notes (Signed)
Patient BIB [facility] from home for evaluation of emesis that started at 0600 this morning. Patient states he has experienced this multiple times previously and was told it was due to cannabis use.

## 2022-06-17 NOTE — ED Triage Notes (Signed)
Patient here POV from Home.  Endorses N/V that began at 0600 today. Associated with Diarrhea as well. Chills as well. Was at another ED but LWBS due to Wait.   NAD Noted during Triage. A&Ox4. GCS 15. Ambulatory.

## 2022-06-17 NOTE — ED Notes (Signed)
Called for triage no answer  

## 2022-06-17 NOTE — ED Notes (Signed)
Called for triage. No response. 

## 2022-06-18 ENCOUNTER — Other Ambulatory Visit (HOSPITAL_COMMUNITY): Payer: Self-pay

## 2022-06-19 ENCOUNTER — Other Ambulatory Visit (HOSPITAL_COMMUNITY): Payer: Self-pay

## 2022-06-19 ENCOUNTER — Telehealth: Payer: Self-pay

## 2022-06-19 NOTE — Telephone Encounter (Signed)
Patient's mother called stating patient and been vomiting blood for 3 days. Patient has been to the ED twice, but does not wait to be evaluated.  Patient's mother also reported patient has not been taking his Biktarvy daily. Patient mother stated she has some concerns that the patient is not being responsible with taking his medication and would like to discuss [place] options at patient's next appointment. I advised the patient's mother that the patient should go back to ED and will need to wait to be evaluated by a provider there. I have also made the patient a follow up appointment with our office as well.  Rodderick Holtzer T Brooks Sailors

## 2022-06-19 NOTE — Telephone Encounter (Signed)
Yes he is scheduled with you and mother stated she is more than likely coming with him

## 2022-06-22 ENCOUNTER — Emergency Department (HOSPITAL_COMMUNITY)
Admission: EM | Admit: 2022-06-22 | Discharge: 2022-06-22 | Disposition: A | Discharge status: Home / Self Care | Payer: BLUE CROSS/BLUE SHIELD | Attending: Emergency Medicine | Admitting: Emergency Medicine

## 2022-06-22 ENCOUNTER — Emergency Department (HOSPITAL_COMMUNITY): Payer: BLUE CROSS/BLUE SHIELD

## 2022-06-22 ENCOUNTER — Encounter (HOSPITAL_COMMUNITY): Payer: Self-pay

## 2022-06-22 ENCOUNTER — Other Ambulatory Visit: Payer: Self-pay

## 2022-06-22 DIAGNOSIS — R112 Nausea with vomiting, unspecified: Secondary | ICD-10-CM | POA: Diagnosis not present

## 2022-06-22 DIAGNOSIS — R197 Diarrhea, unspecified: Secondary | ICD-10-CM | POA: Insufficient documentation

## 2022-06-22 DIAGNOSIS — E876 Hypokalemia: Secondary | ICD-10-CM | POA: Insufficient documentation

## 2022-06-22 DIAGNOSIS — Z21 Asymptomatic human immunodeficiency virus [HIV] infection status: Secondary | ICD-10-CM | POA: Insufficient documentation

## 2022-06-22 DIAGNOSIS — R7401 Elevation of levels of liver transaminase levels: Secondary | ICD-10-CM | POA: Diagnosis not present

## 2022-06-22 LAB — LIPASE, BLOOD: Lipase: 28 U/L (ref 11–51)

## 2022-06-22 LAB — COMPREHENSIVE METABOLIC PANEL
ALT: 57 U/L — ABNORMAL HIGH (ref 0–44)
AST: 138 U/L — ABNORMAL HIGH (ref 15–41)
Albumin: 4.6 g/dL (ref 3.5–5.0)
Alkaline Phosphatase: 53 U/L (ref 38–126)
Anion gap: 13 (ref 5–15)
BUN: 16 mg/dL (ref 6–20)
CO2: 26 mmol/L (ref 22–32)
Calcium: 9.3 mg/dL (ref 8.9–10.3)
Chloride: 92 mmol/L — ABNORMAL LOW (ref 98–111)
Creatinine, Ser: 1.07 mg/dL (ref 0.61–1.24)
GFR, Estimated: >60 mL/min (ref >60)
Glucose, Bld: 115 mg/dL — ABNORMAL HIGH (ref 70–99)
Potassium: 2.6 mmol/L — CL (ref 3.5–5.1)
Sodium: 131 mmol/L — ABNORMAL LOW (ref 135–145)
Total Bilirubin: 2 mg/dL — ABNORMAL HIGH (ref 0.3–1.2)
Total Protein: 8.9 g/dL — ABNORMAL HIGH (ref 6.5–8.1)

## 2022-06-22 LAB — URINALYSIS, ROUTINE W REFLEX MICROSCOPIC
Bilirubin Urine: NEGATIVE
Glucose, UA: NEGATIVE mg/dL
Hgb urine dipstick: NEGATIVE
Ketones, ur: NEGATIVE mg/dL
Leukocytes,Ua: NEGATIVE
Nitrite: NEGATIVE
Protein, ur: NEGATIVE mg/dL
Specific Gravity, Urine: <1.005 — ABNORMAL LOW (ref 1.005–1.030)
pH: 7.5 (ref 5.0–8.0)

## 2022-06-22 LAB — CBC WITH DIFFERENTIAL/PLATELET
Abs Immature Granulocytes: 0.02 10*3/uL (ref 0.00–0.07)
Basophils Absolute: 0 10*3/uL (ref 0.0–0.1)
Basophils Relative: 0 %
Eosinophils Absolute: 0 10*3/uL (ref 0.0–0.5)
Eosinophils Relative: 0 %
HCT: 53.5 % — ABNORMAL HIGH (ref 39.0–52.0)
Hemoglobin: 17.3 g/dL — ABNORMAL HIGH (ref 13.0–17.0)
Immature Granulocytes: 0 %
Lymphocytes Relative: 48 %
Lymphs Abs: 2.9 10*3/uL (ref 0.7–4.0)
MCH: 25.6 pg — ABNORMAL LOW (ref 26.0–34.0)
MCHC: 32.3 g/dL (ref 30.0–36.0)
MCV: 79 fL — ABNORMAL LOW (ref 80.0–100.0)
Monocytes Absolute: 0.7 10*3/uL (ref 0.1–1.0)
Monocytes Relative: 12 %
Neutro Abs: 2.5 10*3/uL (ref 1.7–7.7)
Neutrophils Relative %: 40 %
Platelets: 236 10*3/uL (ref 150–400)
RBC: 6.77 MIL/uL — ABNORMAL HIGH (ref 4.22–5.81)
RDW: 13.4 % (ref 11.5–15.5)
WBC: 6.1 10*3/uL (ref 4.0–10.5)
nRBC: 0 % (ref 0.0–0.2)

## 2022-06-22 LAB — I-STAT CHEM 8, ED
BUN: 16 mg/dL (ref 6–20)
Calcium, Ion: 1.12 mmol/L — ABNORMAL LOW (ref 1.15–1.40)
Chloride: 93 mmol/L — ABNORMAL LOW (ref 98–111)
Creatinine, Ser: 1 mg/dL (ref 0.61–1.24)
Glucose, Bld: 119 mg/dL — ABNORMAL HIGH (ref 70–99)
HCT: 57 % — ABNORMAL HIGH (ref 39.0–52.0)
Hemoglobin: 19.4 g/dL — ABNORMAL HIGH (ref 13.0–17.0)
Potassium: 2.8 mmol/L — ABNORMAL LOW (ref 3.5–5.1)
Sodium: 135 mmol/L (ref 135–145)
TCO2: 27 mmol/L (ref 22–32)

## 2022-06-22 LAB — MAGNESIUM: Magnesium: 2.6 mg/dL — ABNORMAL HIGH (ref 1.7–2.4)

## 2022-06-22 MED ORDER — IOHEXOL 300 MG/ML  SOLN
100.0000 mL | Freq: Once | INTRAMUSCULAR | Status: AC | PRN
  Administered 2022-06-22: 100 mL via INTRAVENOUS

## 2022-06-22 MED ORDER — POTASSIUM CHLORIDE CRYS ER 20 MEQ PO TBCR: 20.0000 meq | EXTENDED_RELEASE_TABLET | Freq: Every day | ORAL | 0 refills | Status: DC

## 2022-06-22 MED ORDER — POTASSIUM CHLORIDE 10 MEQ/100ML IV SOLN
10.0000 meq | INTRAVENOUS | Status: AC
  Administered 2022-06-22 (×2): 10 meq via INTRAVENOUS
  Filled 2022-06-22 (×3): qty 100

## 2022-06-22 MED ORDER — POTASSIUM CHLORIDE CRYS ER 20 MEQ PO TBCR
40.0000 meq | EXTENDED_RELEASE_TABLET | Freq: Once | ORAL | Status: AC
  Administered 2022-06-22: 40 meq via ORAL
  Filled 2022-06-22: qty 2

## 2022-06-22 MED ORDER — ONDANSETRON 4 MG PO TBDP
4.0000 mg | ORAL_TABLET | Freq: Once | ORAL | Status: AC
  Administered 2022-06-22: 4 mg via ORAL
  Filled 2022-06-22: qty 1

## 2022-06-22 MED ORDER — ONDANSETRON 4 MG PO TBDP: 4.0000 mg | ORAL_TABLET | Freq: Three times a day (TID) | ORAL | 0 refills | Status: DC | PRN

## 2022-06-22 MED ORDER — SODIUM CHLORIDE 0.9 % IV BOLUS
1000.0000 mL | Freq: Once | INTRAVENOUS | Status: AC
  Administered 2022-06-22: 1000 mL via INTRAVENOUS

## 2022-06-22 NOTE — ED Notes (Signed)
Patient was given some PO fluids and states that he has no nausea or vomiting at this time

## 2022-06-22 NOTE — ED Provider Triage Note (Signed)
Emergency Medicine Provider Triage Evaluation Note  Matthew Petersen , a 22 y.o. male  was evaluated in triage.  Pt complains of upper abdominal pain with nausea and vomiting x 6 days. Reports dark emesis and diarrhea at onset. Emesis at this point is watery, no stool output x 4 days thinks related to decreased intake.  Prior similar symptoms, thought related to marijuana. Stopped marijuana and symptoms continued, now symptoms possibly related to medications. Sees ID for HIV, reports compliance.  [facility] for [facility] but has left due to long waits.   Review of Systems  Positive: As above Negative: As above  Physical Exam  BP (!) 145/104 (BP Location: Right Arm)   Pulse (!) 109   Temp 98.9 F (37.2 C) (Oral)   Resp 20   Ht 5\' 8"  (1.727 m)   Wt 81.2 kg   SpO2 100%   BMI 27.22 kg/m  Gen:   Awake, no distress   Resp:  Normal effort  MSK:   Moves extremities without difficulty  Other:    Medical Decision Making  Medically screening exam initiated at 11:08 AM.  Appropriate orders placed.  Matthew Petersen was informed that the remainder of the evaluation will be completed by another provider, this initial triage assessment does not replace that evaluation, and the importance of remaining in the ED until their evaluation is complete.     Tacy Learn, PA-C 06/22/22 1110

## 2022-06-22 NOTE — ED Provider Notes (Signed)
 Patient was handed off to me by Army Melia, PA.  Plan at handoff was to wait for pending urine to ensure that there was no active signs of any UTI or possible stone or nephrolithiasis.  I discussed with patient his current symptom presentation he denied any urinary symptoms, painful urination, hematuria. Physical Exam  BP (!) 125/91   Pulse 81   Temp 98.8 F (37.1 C) (Oral)   Resp (!) 24   Ht 5\' 8"  (1.727 m)   Wt 81.2 kg   SpO2 96%   BMI 27.22 kg/m   Physical Exam Vitals and nursing note reviewed.  Constitutional:      Appearance: Normal appearance.  HENT:     Head: Normocephalic and atraumatic.     Nose: Nose normal.     Mouth/Throat:     Mouth: Mucous membranes are dry.  Eyes:     Conjunctiva/sclera: Conjunctivae normal.  Cardiovascular:     Rate and Rhythm: Normal rate and regular rhythm.  Pulmonary:     Effort: Pulmonary effort is normal.     Breath sounds: Normal breath sounds.  Abdominal:     General: Abdomen is flat. There is no distension.     Palpations: There is no mass.     Tenderness: There is no abdominal tenderness. There is no right CVA tenderness or left CVA tenderness.  Musculoskeletal:        General: Normal range of motion.  Skin:    General: Skin is warm and dry.     Capillary Refill: Capillary refill takes less than 2 seconds.  Neurological:     General: No focal deficit present.     Mental Status: He is alert.  Psychiatric:        Mood and Affect: Mood normal.     Procedures  Procedures  ED Course / MDM    Medical Decision Making Amount and/or Complexity of Data Reviewed Labs: ordered. Radiology: ordered.  Risk Prescription drug management.   Patient was signed out to me by Army Melia, PA.  Plan was to wait for pending urine for possible treatment with antibiotic therapy if UTI was suspected.  At this time patient did not report to me any urinary symptoms such as dysuria, hematuria, flank pain, urinary hesitancy or increased urine  frequency.  I believe patient is safe to discharge home with pending results on urinalysis and if urinalysis is positive for signs of infection and antibiotic will be sent to his pharmacy.  Patient reports to me that he feels better with treatment that has been given to him here in the emergency department.  Patient denied any further questions at the end of our assessment and verbalized understanding all return precautions.  Patient agreeable to plan to discharge home.       Smitty Knudsen, PA-C 06/22/22 1555    Margarita Grizzle, MD 06/23/22 1154

## 2022-06-22 NOTE — ED Provider Notes (Signed)
[facility]-EMERGENCY DEPT Provider Note   CSN: [id number] Arrival date & time: 06/22/22  1023     History  Chief Complaint  Patient presents with   Emesis    Matthew Petersen is a 22 y.o. male.   Pt complains of upper abdominal pain with nausea and vomiting x 6 days. Reports dark emesis and diarrhea at onset. Emesis at this point is watery, no stool output x 4 days thinks related to decreased intake.  Prior similar symptoms, thought related to marijuana. Stopped marijuana and symptoms continued, now symptoms possibly related to medications. Sees ID for HIV, reports compliance.  [facility] for [facility] but has left due to long waits.         Home Medications Prior to Admission medications   Medication Sig Start Date End Date Taking? Authorizing Provider  ondansetron (ZOFRAN-ODT) 4 MG disintegrating tablet Take 1 tablet (4 mg total) by mouth every 8 (eight) hours as needed for nausea or vomiting. 06/22/22  Yes Jeannie Fend, PA-C  potassium chloride SA (KLOR-CON M) 20 MEQ tablet Take 1 tablet (20 mEq total) by mouth daily. 06/22/22  Yes Jeannie Fend, PA-C  bictegravir-emtricitabine-tenofovir AF (BIKTARVY) 50-200-25 MG TABS tablet Take 1 tablet by mouth daily. 06/01/22   Veryl Speak, FNP      Allergies    Haldol [haloperidol]    Review of Systems   Review of Systems Negative except as per HPI Physical Exam Updated Vital Signs BP (!) 125/91   Pulse 81   Temp 98.8 F (37.1 C) (Oral)   Resp (!) 24   Ht 5\' 8"  (1.727 m)   Wt 81.2 kg   SpO2 96%   BMI 27.22 kg/m  Physical Exam Vitals and nursing note reviewed.  Constitutional:      General: He is not in acute distress.    Appearance: He is well-developed. He is not diaphoretic.  HENT:     Head: Normocephalic and atraumatic.     Mouth/Throat:     Mouth: Mucous membranes are moist.  Cardiovascular:     Rate and Rhythm: Normal rate and regular rhythm.     Heart  sounds: Normal heart sounds.  Pulmonary:     Effort: Pulmonary effort is normal.     Breath sounds: Normal breath sounds.  Abdominal:     Palpations: Abdomen is soft.     Tenderness: There is no abdominal tenderness.  Musculoskeletal:     Right lower leg: No edema.     Left lower leg: No edema.  Skin:    General: Skin is warm and dry.  Neurological:     Mental Status: He is alert and oriented to person, place, and time.  Psychiatric:        Behavior: Behavior normal.     ED Results / Procedures / Treatments   Labs (all labs ordered are listed, but only abnormal results are displayed) Labs Reviewed  CBC WITH DIFFERENTIAL/PLATELET - Abnormal; Notable for the following components:      Result Value   RBC 6.77 (*)    Hemoglobin 17.3 (*)    HCT 53.5 (*)    MCV 79.0 (*)    MCH 25.6 (*)    All other components within normal limits  COMPREHENSIVE METABOLIC PANEL - Abnormal; Notable for the following components:   Sodium 131 (*)    Potassium 2.6 (*)    Chloride 92 (*)    Glucose, Bld 115 (*)  Total Protein 8.9 (*)    AST 138 (*)    ALT 57 (*)    Total Bilirubin 2.0 (*)    All other components within normal limits  MAGNESIUM - Abnormal; Notable for the following components:   Magnesium 2.6 (*)    All other components within normal limits  I-STAT CHEM 8, ED - Abnormal; Notable for the following components:   Potassium 2.8 (*)    Chloride 93 (*)    Glucose, Bld 119 (*)    Calcium, Ion 1.12 (*)    Hemoglobin 19.4 (*)    HCT 57.0 (*)    All other components within normal limits  LIPASE, BLOOD  URINALYSIS, ROUTINE W REFLEX MICROSCOPIC    EKG EKG Interpretation  Date/Time:  Monday June 22 2022 12:36:41 EST Ventricular Rate:  84 PR Interval:  155 QRS Duration: 106 QT Interval:  482 QTC Calculation: 570 R Axis:   96 Text Interpretation: Sinus rhythm Borderline right axis deviation Prolonged QT interval Confirmed by Kristine Royal [phone]) on 06/22/2022 12:50:54  PM  Radiology CT Abdomen Pelvis W Contrast  Result Date: 06/22/2022 CLINICAL DATA:  Abdominal pain, acute, nonlocalized EXAM: CT ABDOMEN AND PELVIS WITH CONTRAST TECHNIQUE: Multidetector CT imaging of the abdomen and pelvis was performed using the standard protocol following bolus administration of intravenous contrast. RADIATION DOSE REDUCTION: This exam was performed according to the departmental dose-optimization program which includes automated exposure control, adjustment of the mA and/or kV according to patient size and/or use of iterative reconstruction technique. CONTRAST:  OMNIPAQUE IOHEXOL 300 MG/ML  SOLN COMPARISON:  08/13/2018 FINDINGS: Lower chest: No acute abnormality. Hepatobiliary: Focal fatty infiltration of the liver anteriorly along the falciform ligament as before. No other hepatic abnormality or biliary dilatation. Hepatic and portal veins are patent. Gallbladder unremarkable. Common bile duct nondilated. Pancreas: Unremarkable. No pancreatic ductal dilatation or surrounding inflammatory changes. Spleen: Normal in size without focal abnormality. Adrenals/Urinary Tract: Adrenal glands are unremarkable. Kidneys are normal, without renal calculi, focal lesion, or hydronephrosis. Bladder is unremarkable. Stomach/Bowel: Stomach is within normal limits. Appendix appears normal. No evidence of bowel wall thickening, distention, or inflammatory changes. Vascular/Lymphatic: No significant vascular findings are present. No enlarged abdominal or pelvic lymph nodes. Reproductive: No significant finding by CT Other: No abdominal wall hernia or abnormality. No abdominopelvic ascites. Musculoskeletal: No acute or significant osseous findings. IMPRESSION: No acute intra-abdominal or pelvic finding by CT. Electronically Signed   By: Judie Petit.  Shick M.D.   On: 06/22/2022 12:55    Procedures .Critical Care  Performed by: Jeannie Fend, PA-C Authorized by: Jeannie Fend, PA-C   Critical care provider  statement:    Critical care time (minutes):  30   Critical care was time spent personally by me on the following activities:  Development of treatment plan with patient or surrogate, discussions with consultants, evaluation of patient's response to treatment, examination of patient, ordering and review of laboratory studies, ordering and review of radiographic studies, ordering and performing treatments and interventions, pulse oximetry, re-evaluation of patient's condition and review of old charts     Medications Ordered in ED Medications  potassium chloride 10 mEq in 100 mL IVPB (0 mEq Intravenous Stopped 06/22/22 1439)  ondansetron (ZOFRAN-ODT) disintegrating tablet 4 mg (4 mg Oral Given 06/22/22 1124)  sodium chloride 0.9 % bolus 1,000 mL (0 mLs Intravenous Stopped 06/22/22 1439)  potassium chloride SA (KLOR-CON M) CR tablet 40 mEq (40 mEq Oral Given 06/22/22 1237)  iohexol (OMNIPAQUE) 300 MG/ML solution 100  mL (100 mLs Intravenous Contrast Given 06/22/22 1224)    ED Course/ Medical Decision Making/ A&P                           Medical Decision Making Amount and/or Complexity of Data Reviewed Labs: ordered. Radiology: ordered.  Risk Prescription drug management.   This patient presents to the ED for concern of abdominal pain with nausea and vomiting, this involves an extensive number of treatment options, and is a complaint that carries with it a high risk of complications and morbidity.  The differential diagnosis includes but not limited to gastroparesis, gastritis, bowel obstruction   Co morbidities that complicate the patient evaluation  HIV   Additional history obtained:  External records from outside source obtained and reviewed including office visit to ID dated 11/11/2021 with plan to recheck in 8 weeks after restarting Biktarvy to reassess viral load response.  Covered empirically with IM Rocephin for new partner.  CD4 at that visit 675   Lab Tests:  I Ordered, and  personally interpreted labs.  The pertinent results include: CBC with normal WBC.  CMP with hypokalemia, potassium 2.6.  Mildly elevated AST and ALT, bilirubin 2.0 with normal alk phos.  Magnesium slightly elevated 2.6.  Lipase normal.   Imaging Studies ordered:  I ordered imaging studies including CT abdomen pelvis with contrast I independently visualized and interpreted imaging which showed no acute abnormality I agree with the radiologist interpretation   Cardiac Monitoring: / EKG:  The patient was maintained on a cardiac monitor.  I personally viewed and interpreted the cardiac monitored which showed an underlying rhythm of: Sinus rhythm, rate 84   Problem List / ED Course / Critical interventions / Medication management  22 year old male with history of HIV, reports compliant with medication however on further questioning, has been out of his medications and lost to follow-up since May with plan to recheck this week.  Reports upper abdominal pain with nausea and vomiting.  Patient is actively spitting into a bag, states he is afraid to swallow anything as he is afraid this will cause him to vomit.  Labs reviewed, found of hypokalemia with potassium of 2.6, replaced orally as well as with IV.  Patient declines final IV potassium run due to burning in his arm.  He is discharged with additional 5-day course of oral potassium and advised to recheck with his doctor.  His vomiting has resolved, he is tolerating p.o. fluids including oral potassium at time of discharge.  Care signed out at change of shift pending urinalysis. I ordered medication including Zofran, potassium, IV fluids for nausea, vomiting, hypokalemia Reevaluation of the patient after these medicines showed that the patient resolved I have reviewed the patients home medicines and have made adjustments as needed   Social Determinants of Health:  Lost to follow-up with ID clinic, plans to follow-up later this week   Test /  Admission - Considered:  Consider ultrasound for elevated LFTs however CT negative for stones or gallbladder wall thickening, no right upper quadrant tenderness on exam         Final Clinical Impression(s) / ED Diagnoses Final diagnoses:  Nausea and vomiting, unspecified vomiting type  Hypokalemia    Rx / DC Orders ED Discharge Orders          Ordered    potassium chloride SA (KLOR-CON M) 20 MEQ tablet  Daily        06/22/22 1444  ondansetron (ZOFRAN-ODT) 4 MG disintegrating tablet  Every 8 hours PRN        06/22/22 1444              Alden Hipp 06/22/22 1509    Wynetta Fines, MD 06/25/22 [phone]

## 2022-06-22 NOTE — ED Triage Notes (Addendum)
patient c/o N/V and LUQ abdominal pain x 6 days. Patient states he was having diarrhea, but none in the past 4 days.   Patient added in triage that  the first few days of vomiting he had dark red in his emesis.

## 2022-06-22 NOTE — Discharge Instructions (Addendum)
Zofran as needed as prescribed for nausea and vomiting. Potassium supplement daily starting tomorrow.  Check with your doctor for repeat lab work, repeat potassium check.  Return to ER for worsening or concerning symptoms.

## 2022-06-30 ENCOUNTER — Encounter: Payer: Self-pay | Admitting: Infectious Diseases

## 2022-06-30 ENCOUNTER — Ambulatory Visit (INDEPENDENT_AMBULATORY_CARE_PROVIDER_SITE_OTHER): Payer: BLUE CROSS/BLUE SHIELD | Admitting: Infectious Diseases

## 2022-06-30 ENCOUNTER — Other Ambulatory Visit: Payer: Self-pay

## 2022-06-30 VITALS — BP 119/80 | HR 92 | Temp 98.7°F | Resp 16 | Wt 166.6 lb

## 2022-06-30 DIAGNOSIS — B20 Human immunodeficiency virus [HIV] disease: Secondary | ICD-10-CM

## 2022-06-30 DIAGNOSIS — R110 Nausea: Secondary | ICD-10-CM

## 2022-06-30 MED ORDER — SYMTUZA 800-150-200-10 MG PO TABS: 1.0000 | ORAL_TABLET | Freq: Every day | ORAL | 0 refills | Status: DC

## 2022-06-30 NOTE — Assessment & Plan Note (Addendum)
He feels strongly this may be a/w his biktarvy as a side effect which has impacted how he takes his medication. Will switch to symtuza once daily to see if this helps symptoms. Considered cannabis hyperemesis syndrome but feels that these episodes persisted in patterns despite cold [place] quitting cannabis use.  He also has some symptoms suggestive of acid reflux. Could consider trial of daily PPI for a few weeks to see if that helps. We can do GI referral as well as next step to consider alternative causes if he has persistent symptoms despite medication switch.

## 2022-06-30 NOTE — Patient Instructions (Addendum)
STOP the biktarvy   START Symtuza  Pair it with the largest meal of the day where you can take it consistently.   Thank you for talking to our research team - would like to help get you switched to Cabenuva injections. Need to get you undetectable to help make that happen.   Would like to see you back in 4 weeks if we can to see how you are doing on the new medicine.  Can come to see either me, Marya Amsler or our wonderful pharmacist team

## 2022-06-30 NOTE — Progress Notes (Signed)
Subjective:    CC: WAYLAN BUSTA is a 22 y.o. male patient here today with concern over STI exposure/symptoms .    HPI: Matthew Petersen is here for HIV follow up care.   ER visit reviewed recently for refractory vomiting and nausea. Felt a little better after IVF but did not resolve symptoms. Zofran was moderately helpful. Today is the 2nd day since January 2nd of this year that he has not had any nausea or vomiting and finally feels like his appetite is coming back. He suspects these illnesses have a pattern and have happened yearly around the same time the last few years. Usually has a spell of 2 weeks with nonstop N/V. Always notices it the same time of year and seems to have a pattern with these episodes. Does have some episodes over the summer but this is infrequent. Feels that the medication when he is off of it for a period of time it can be tough on his stomach to re-introduce it and is interested in switching to alternative.  He has considered cannabis hyperemesis syndrome as well but during his experimentation with being off of this symptoms seemed to persist.  Was back on biktarvy recently before current illness - between May through October he had no noticeable trouble aside from some low grade nausea a/w taking his medication.     Review of Systems: Review of Systems  Constitutional:  Negative for chills and fever.  HENT:  Negative for sore throat.   Eyes:  Negative for visual disturbance.  Gastrointestinal:  Negative for abdominal pain, anal bleeding and rectal pain.  Genitourinary:  Negative for dysuria, genital sores, penile discharge, penile pain, scrotal swelling and testicular pain.  Musculoskeletal:  Negative for arthralgias and joint swelling.  Skin:  Negative for rash.  Neurological:  Negative for headaches.  Hematological:  Negative for adenopathy.    Sexual History:  Prefers male partners, oral sex only Condom use none   Past Medical History:  Diagnosis  Date   HIV (human immunodeficiency virus infection) ([place])     Outpatient Medications Prior to Visit  Medication Sig Dispense Refill   ondansetron (ZOFRAN-ODT) 4 MG disintegrating tablet Take 1 tablet (4 mg total) by mouth every 8 (eight) hours as needed for nausea or vomiting. (Patient not taking: Reported on 06/30/2022) 12 tablet 0   potassium chloride SA (KLOR-CON M) 20 MEQ tablet Take 1 tablet (20 mEq total) by mouth daily. (Patient not taking: Reported on 06/30/2022) 5 tablet 0   bictegravir-emtricitabine-tenofovir AF (BIKTARVY) 50-200-25 MG TABS tablet Take 1 tablet by mouth daily. (Patient not taking: Reported on 06/30/2022) 30 tablet 0   No facility-administered medications prior to visit.    Allergies  Allergen Reactions   Haldol [Haloperidol]     Dystonic reaction    Family History  Problem Relation Age of Onset   Diabetes Mother    Multiple sclerosis Father         Objective:  Objective  Vitals:   06/30/22 1435  BP: 119/80  Pulse: 92  Resp: 16  Temp: 98.7 F (37.1 C)  SpO2: 99%   Body mass index is 25.33 kg/m.   Physical Exam  Physical Exam Constitutional:      Appearance: Normal appearance. He is not ill-appearing.  HENT:     Head: Normocephalic.     Mouth/Throat:     Mouth: Mucous membranes are moist.     Pharynx: Oropharynx is clear.  Eyes:  General: No scleral icterus. Cardiovascular:     Rate and Rhythm: Normal rate and regular rhythm.  Pulmonary:     Effort: Pulmonary effort is normal.  Musculoskeletal:        General: Normal range of motion.     Cervical back: Normal range of motion.  Skin:    Coloration: Skin is not jaundiced or pale.  Neurological:     Mental Status: He is alert and oriented to person, place, and time.  Psychiatric:        Mood and Affect: Mood normal.        Judgment: Judgment normal.        Assessment & Plan:    Problem List Items Addressed This Visit       Unprioritized   HIV disease ([place])    Munachimso  is here today for HIV care. He would like to trial switch of medication since he has determined nausea / vomting episodes may be related to his biktarvy. We will try switch to alternative STR with Symtuza to see if this helps. Counseled on lead inside effects to expect. Take with food everyday.  Will have him meet with research for [place] study for consideration to switch to cabenuva for management. He is interested in this. Will have him come back to speak with research Friday and perform genotype then.  Last CD4 still well preserved > 600. No concern for OI contributing to cyclic N/V.        Relevant Medications   Darunavir-Cobicistat-Emtricitabine-Tenofovir Alafenamide (SYMTUZA) 800-150-200-10 MG TABS   Chronic nausea - Primary    He feels strongly this may be a/w his biktarvy as a side effect which has impacted how he takes his medication. Will switch to symtuza once daily to see if this helps symptoms. Considered cannabis hyperemesis syndrome but feels that these episodes persisted in patterns despite cold [place] quitting cannabis use.  He also has some symptoms suggestive of acid reflux. Could consider trial of daily PPI for a few weeks to see if that helps. We can do GI referral as well as next step to consider alternative causes if he has persistent symptoms despite medication switch.        Janene Madeira, MSN, NP-C [facility] Office: [phone] Pager: [phone]

## 2022-06-30 NOTE — Assessment & Plan Note (Signed)
Matthew Petersen is here today for HIV care. He would like to trial switch of medication since he has determined nausea / vomting episodes may be related to his biktarvy. We will try switch to alternative STR with Symtuza to see if this helps. Counseled on lead inside effects to expect. Take with food everyday.  Will have him meet with research for [place] study for consideration to switch to cabenuva for management. He is interested in this. Will have him come back to speak with research Friday and perform genotype then.  Last CD4 still well preserved > 600. No concern for OI contributing to cyclic N/V.

## 2022-07-02 ENCOUNTER — Other Ambulatory Visit (HOSPITAL_COMMUNITY): Payer: Self-pay

## 2022-07-02 ENCOUNTER — Other Ambulatory Visit: Payer: Self-pay | Admitting: Pharmacist

## 2022-07-02 DIAGNOSIS — B20 Human immunodeficiency virus [HIV] disease: Secondary | ICD-10-CM

## 2022-07-02 MED ORDER — SYMTUZA 800-150-200-10 MG PO TABS: 1.0000 | ORAL_TABLET | Freq: Every day | ORAL | 0 refills | Status: AC

## 2022-07-02 NOTE — Progress Notes (Signed)
Medication Samples have been provided to the patient.  Drug name: Symtuza        Strength: 800/150/200/10 mg Qty: 30 tablets (1 bottle)  LOT: [id number]          Exp.Date: 40/2025  Dosing instructions: Take one tablet by mouth once daily with food  The patient has been instructed regarding the correct time, dose, and frequency of taking this medication, including desired effects and most common side effects.   Jaeceon Michelin L. Eber Hong, PharmD, BCIDP, AAHIVP, CPP Clinical Pharmacist Practitioner Infectious Diseases [facility] 05/27/2020, 10:07 AM

## 2022-07-03 ENCOUNTER — Encounter: Payer: BLUE CROSS/BLUE SHIELD | Admitting: *Deleted

## 2022-07-06 ENCOUNTER — Other Ambulatory Visit (HOSPITAL_COMMUNITY): Payer: Self-pay

## 2022-07-10 ENCOUNTER — Other Ambulatory Visit: Payer: Self-pay

## 2022-07-21 ENCOUNTER — Telehealth: Payer: Self-pay

## 2022-07-21 NOTE — Telephone Encounter (Signed)
Called pt to sched a needed f/u visit with either Marya Amsler or [place]. No answer and left a HIPAA Compliant VM.

## 2022-07-22 ENCOUNTER — Other Ambulatory Visit (HOSPITAL_COMMUNITY): Payer: Self-pay

## 2022-07-24 ENCOUNTER — Other Ambulatory Visit (HOSPITAL_COMMUNITY): Payer: Self-pay

## 2022-09-01 ENCOUNTER — Other Ambulatory Visit (HOSPITAL_COMMUNITY): Payer: Self-pay

## 2022-09-28 ENCOUNTER — Other Ambulatory Visit (HOSPITAL_COMMUNITY): Payer: Self-pay

## 2022-11-12 ENCOUNTER — Emergency Department (HOSPITAL_COMMUNITY)
Admission: EM | Admit: 2022-11-12 | Discharge: 2022-11-13 | Discharge status: Against Medical Advice | Payer: BLUE CROSS/BLUE SHIELD | Attending: Emergency Medicine | Admitting: Emergency Medicine

## 2022-11-12 ENCOUNTER — Other Ambulatory Visit: Payer: Self-pay

## 2022-11-12 DIAGNOSIS — R1031 Right lower quadrant pain: Secondary | ICD-10-CM | POA: Insufficient documentation

## 2022-11-12 DIAGNOSIS — R197 Diarrhea, unspecified: Secondary | ICD-10-CM | POA: Insufficient documentation

## 2022-11-12 DIAGNOSIS — Z5321 Procedure and treatment not carried out due to patient leaving prior to being seen by health care provider: Secondary | ICD-10-CM | POA: Insufficient documentation

## 2022-11-12 DIAGNOSIS — R112 Nausea with vomiting, unspecified: Secondary | ICD-10-CM | POA: Insufficient documentation

## 2022-11-12 LAB — COMPREHENSIVE METABOLIC PANEL
ALT: 80 U/L — ABNORMAL HIGH (ref 0–44)
AST: 53 U/L — ABNORMAL HIGH (ref 15–41)
Albumin: 4.7 g/dL (ref 3.5–5.0)
Alkaline Phosphatase: 55 U/L (ref 38–126)
Anion gap: 14 (ref 5–15)
BUN: 17 mg/dL (ref 6–20)
CO2: 25 mmol/L (ref 22–32)
Calcium: 10 mg/dL (ref 8.9–10.3)
Chloride: 98 mmol/L (ref 98–111)
Creatinine, Ser: 1.18 mg/dL (ref 0.61–1.24)
GFR, Estimated: >60 mL/min (ref >60)
Glucose, Bld: 114 mg/dL — ABNORMAL HIGH (ref 70–99)
Potassium: 3.2 mmol/L — ABNORMAL LOW (ref 3.5–5.1)
Sodium: 137 mmol/L (ref 135–145)
Total Bilirubin: 1.8 mg/dL — ABNORMAL HIGH (ref 0.3–1.2)
Total Protein: 8.9 g/dL — ABNORMAL HIGH (ref 6.5–8.1)

## 2022-11-12 LAB — URINALYSIS, ROUTINE W REFLEX MICROSCOPIC
Glucose, UA: NEGATIVE mg/dL
Hgb urine dipstick: NEGATIVE
Ketones, ur: 80 mg/dL — AB
Nitrite: NEGATIVE
Protein, ur: 100 mg/dL — AB
Specific Gravity, Urine: 1.033 — ABNORMAL HIGH (ref 1.005–1.030)
pH: 5 (ref 5.0–8.0)

## 2022-11-12 LAB — CBC
HCT: 48.7 % (ref 39.0–52.0)
Hemoglobin: 15.7 g/dL (ref 13.0–17.0)
MCH: 25.9 pg — ABNORMAL LOW (ref 26.0–34.0)
MCHC: 32.2 g/dL (ref 30.0–36.0)
MCV: 80.2 fL (ref 80.0–100.0)
Platelets: 265 10*3/uL (ref 150–400)
RBC: 6.07 MIL/uL — ABNORMAL HIGH (ref 4.22–5.81)
RDW: 14.4 % (ref 11.5–15.5)
WBC: 6.1 10*3/uL (ref 4.0–10.5)
nRBC: 0 % (ref 0.0–0.2)

## 2022-11-12 LAB — LIPASE, BLOOD: Lipase: 23 U/L (ref 11–51)

## 2022-11-12 MED ORDER — ONDANSETRON 4 MG PO TBDP
4.0000 mg | ORAL_TABLET | Freq: Once | ORAL | Status: AC | PRN
  Administered 2022-11-12: 4 mg via ORAL
  Filled 2022-11-12: qty 1

## 2022-11-12 NOTE — ED Triage Notes (Signed)
Pt having NVD and abdominal pain RLQ for 4 days. Pt unable to keep anything down. Denies chest pain SOB

## 2022-11-13 NOTE — ED Notes (Signed)
Pt called multiple times no answer 

## 2023-01-05 ENCOUNTER — Encounter: Payer: Self-pay | Admitting: Physician Assistant

## 2023-02-17 ENCOUNTER — Other Ambulatory Visit: Payer: Self-pay | Admitting: Physician Assistant

## 2023-02-17 ENCOUNTER — Encounter: Payer: Self-pay | Admitting: Physician Assistant

## 2023-02-17 ENCOUNTER — Ambulatory Visit (INDEPENDENT_AMBULATORY_CARE_PROVIDER_SITE_OTHER): Payer: BLUE CROSS/BLUE SHIELD | Admitting: Physician Assistant

## 2023-02-17 VITALS — BP 96/68 | HR 82 | Ht 68.0 in | Wt 169.8 lb

## 2023-02-17 DIAGNOSIS — F1290 Cannabis use, unspecified, uncomplicated: Secondary | ICD-10-CM | POA: Diagnosis not present

## 2023-02-17 DIAGNOSIS — R112 Nausea with vomiting, unspecified: Secondary | ICD-10-CM | POA: Diagnosis not present

## 2023-02-17 DIAGNOSIS — R1013 Epigastric pain: Secondary | ICD-10-CM

## 2023-02-17 MED ORDER — OMEPRAZOLE 40 MG PO CPDR: 40.0000 mg | DELAYED_RELEASE_CAPSULE | Freq: Every day | ORAL | 2 refills | Status: DC

## 2023-02-17 NOTE — Progress Notes (Signed)
Chief Complaint: Nausea and vomiting   HPI:    Matthew Petersen is a 22 year old male with a past medical history as listed below including HIV, who presents to clinic today for complaint of nausea and vomiting.    04/01/2018 office visit with Dr. Orvan Falconer for nausea and vomiting with multiple ED visits.  Discussed daily marijuana use.  Discussed that symptoms are classic for cannabis hyperemesis syndrome.  At that time test for H. pylori antigen, upper GI series, recommended abstinence from marijuana.    06/30/2022 office visit with infectious disease for chronic nausea and HIV.  He had been in the ER on 06/22/2022 and felt slightly better after fluids.  Apparently experimented with stopping marijuana but did not feel like it helped.  Biktarvy and was switched to Symtuza to see if it helped with nausea and vomiting.    Today, the patient tells me that he has done everything he can think of to try and help with these episodes of nausea and vomiting including coming off of his marijuana for 3 months but it did not help.  Tells me that he will have constant episodes during the months of June and July and then again November through January and in between he has about 1 or 2 episodes a week of nausea that typically resolve with vomiting.  Does explain that he starts with some acid like material in his mouth prior to these vomiting episodes but denies any chronic heartburn or reflux.  Does tell me he wakes up every morning and feels an epigastric discomfort sometimes feels like he needs to have a bowel movement but even doing that does not always help.  Also describes occasional mucus in his stool.  Is never tried a PPI.  No change in diet or stress level.  The change in medications for HIV did not help.    Denies fever, chills, weight loss or symptoms that awaken him from sleep.  Past Medical History:  Diagnosis Date   HIV (human immunodeficiency virus infection) ([facility])     No past surgical history on  file.  Current Outpatient Medications  Medication Sig Dispense Refill   Darunavir-Cobicistat-Emtricitabine-Tenofovir Alafenamide (SYMTUZA) 800-150-200-10 MG TABS Take 1 tablet by mouth daily with breakfast. 30 tablet 0   ondansetron (ZOFRAN-ODT) 4 MG disintegrating tablet Take 1 tablet (4 mg total) by mouth every 8 (eight) hours as needed for nausea or vomiting. (Patient not taking: Reported on 06/30/2022) 12 tablet 0   potassium chloride SA (KLOR-CON M) 20 MEQ tablet Take 1 tablet (20 mEq total) by mouth daily. (Patient not taking: Reported on 06/30/2022) 5 tablet 0   No current facility-administered medications for this visit.    Allergies as of 02/17/2023 - Review Complete 11/12/2022  Allergen Reaction Noted   Haldol [haloperidol]  09/12/2019    Family History  Problem Relation Age of Onset   Diabetes Mother    Multiple sclerosis Father     Social History   Socioeconomic History   Marital status: Single    Spouse name: Not on file   Number of children: 0   Years of education: 13   Highest education level: Not on file  Occupational History   Occupation: Unemployed     Comment: Clinical cytogeneticist at [employer]  Tobacco Use   Smoking status: Never   Smokeless tobacco: Never  Vaping Use   Vaping status: Never Used  Substance and Sexual Activity   Alcohol use: Not Currently    Comment: occasionally  Drug use: Yes    Frequency: 7.0 times per week    Types: Marijuana    Comment: socially, uses tobacco papers   Sexual activity: Not Currently    Comment: accepted condoms  Other Topics Concern   Not on file  Social History Narrative   Not on file   Social Determinants of Health   Financial Resource Strain: Not on file  Food Insecurity: Not on file  Transportation Needs: Not on file  Physical Activity: Not on file  Stress: Not on file  Social Connections: Not on file  Intimate Partner Violence: Not on file    Review of Systems:    Constitutional: No weight loss, fever  or chills Skin: No rash Cardiovascular: No chest pain Respiratory: No SOB  Gastrointestinal: See HPI and otherwise negative Genitourinary: No dysuria  Neurological: No headache, dizziness or syncope Musculoskeletal: No new muscle or joint pain Hematologic: No bleeding Psychiatric: No history of depression or anxiety   Physical Exam:  Vital signs: BP 96/68   Pulse 82   Ht 5\' 8"  (1.727 m)   Wt 169 lb 12.8 oz (77 kg)   BMI 25.82 kg/m    Constitutional:   Pleasant AA male appears to be in NAD, Well developed, Well nourished, alert and cooperative Head:  Normocephalic and atraumatic. Eyes:   PEERL, EOMI. No icterus. Conjunctiva pink. Ears:  Normal auditory acuity. Neck:  Supple Throat: Oral cavity and pharynx without inflammation, swelling or lesion.  Respiratory: Respirations even and unlabored. Lungs clear to auscultation bilaterally.   No wheezes, crackles, or rhonchi.  Cardiovascular: Normal S1, S2. No MRG. Regular rate and rhythm. No peripheral edema, cyanosis or pallor.  Gastrointestinal:  Soft, nondistended, nontender. No rebound or guarding. Normal bowel sounds. No appreciable masses or hepatomegaly. Rectal:  Not performed.  Msk:  Symmetrical without gross deformities. Without edema, no deformity or joint abnormality.  Neurologic:  Alert and  oriented x4;  grossly normal neurologically.  Skin:   Dry and intact without significant lesions or rashes. Psychiatric: Demonstrates good judgement and reason without abnormal affect or behaviors.  RELEVANT LABS AND IMAGING: CBC    Component Value Date/Time   WBC 6.1 11/12/2022 2308   RBC 6.07 (H) 11/12/2022 2308   HGB 15.7 11/12/2022 2308   HGB 13.6 03/17/2021 1428   HCT 48.7 11/12/2022 2308   HCT 43.4 03/17/2021 1428   PLT 265 11/12/2022 2308   PLT 201 03/17/2021 1428   MCV 80.2 11/12/2022 2308   MCV 84 03/17/2021 1428   MCH 25.9 (L) 11/12/2022 2308   MCHC 32.2 11/12/2022 2308   RDW 14.4 11/12/2022 2308   RDW 13.7  03/17/2021 1428   LYMPHSABS 2.9 06/22/2022 1127   LYMPHSABS 3.1 03/17/2021 1428   MONOABS 0.7 06/22/2022 1127   EOSABS 0.0 06/22/2022 1127   EOSABS 0.0 03/17/2021 1428   BASOSABS 0.0 06/22/2022 1127   BASOSABS 0.0 03/17/2021 1428    CMP     Component Value Date/Time   NA 137 11/12/2022 2308   NA 142 07/19/2018 1142   K 3.2 (L) 11/12/2022 2308   CL 98 11/12/2022 2308   CO2 25 11/12/2022 2308   GLUCOSE 114 (H) 11/12/2022 2308   BUN 17 11/12/2022 2308   BUN 8 07/19/2018 1142   CREATININE 1.18 11/12/2022 2308   CREATININE 0.84 03/17/2021 1438   CALCIUM 10.0 11/12/2022 2308   PROT 8.9 (H) 11/12/2022 2308   PROT 7.1 07/19/2018 1142   ALBUMIN 4.7 11/12/2022 2308   ALBUMIN  4.7 07/19/2018 1142   AST 53 (H) 11/12/2022 2308   ALT 80 (H) 11/12/2022 2308   ALKPHOS 55 11/12/2022 2308   BILITOT 1.8 (H) 11/12/2022 2308   BILITOT 1.0 07/19/2018 1142   GFRNONAA >60 11/12/2022 2308   GFRNONAA 102 08/08/2020 1114   GFRAA 118 08/08/2020 1114    Assessment: 1.  Nausea and vomiting: Very frequent episodes, seem to be more frequent in the summer and winter months, no help when took a hiatus from marijuana for 3 months or the change in his HIV meds; consider functional dyspepsia versus gastritis versus other 2.  Epigastric pain: Every morning; consider relation to gastritis versus other 3.  Marijuana use: Daily, but no change in symptoms when he took a break for 3 months 4.  HIV  Plan: 1.  Started the patient on Omeprazole 40 mg 30 minutes before dinner #30 with 3 refills. 2.  Scheduled patient for an EGD in 2 months with Dr. Tomasa Rand.  Did provide the patient a detailed list of risks for the procedure and he agrees to proceed. 3.  Patient to follow in clinic per recommendations after time of procedure.  Matthew Meeker, PA-C [facility] 02/17/2023, 1:35 PM  Cc: No ref. provider found

## 2023-02-17 NOTE — Patient Instructions (Signed)
We have sent the following medications to your pharmacy for you to pick up at your convenience: Omeprazole 40 mg daily 30-60 minutes before dinner.   You have been scheduled for an endoscopy. Please follow written instructions given to you at your visit today.  If you use inhalers (even only as needed), please bring them with you on the day of your procedure.  If you take any of the following medications, they will need to be adjusted prior to your procedure:   DO NOT TAKE 7 DAYS PRIOR TO TEST- Trulicity (dulaglutide) Ozempic, Wegovy (semaglutide) Mounjaro (tirzepatide) Bydureon Bcise (exanatide extended release)  DO NOT TAKE 1 DAY PRIOR TO YOUR TEST Rybelsus (semaglutide) Adlyxin (lixisenatide) Victoza (liraglutide) Byetta (exanatide) ___________________________________________________________________________   _______________________________________________________  If your blood pressure at your visit was 140/90 or greater, please contact your primary care physician to follow up on this.  _______________________________________________________  If you are age 37 or older, your body mass index should be between 23-30. Your Body mass index is 25.82 kg/m. If this is out of the aforementioned range listed, please consider follow up with your Primary Care Provider.  If you are age 65 or younger, your body mass index should be between 19-25. Your Body mass index is 25.82 kg/m. If this is out of the aformentioned range listed, please consider follow up with your Primary Care Provider.   ________________________________________________________  The [facility] providers would like to encourage you to use [facility] to communicate with providers for non-urgent requests or questions.  Due to long hold times on the telephone, sending your provider a message by [facility] may be a faster and more efficient way to get a response.  Please allow 48 business hours for a response.  Please remember  that this is for non-urgent requests.  _______________________________________________________

## 2023-02-18 NOTE — Telephone Encounter (Signed)
Please submit PA.

## 2023-02-23 NOTE — Progress Notes (Signed)
Agree with the assessment and plan as outlined by Hyacinth Meeker, PA-C.  Cannabinoid hyperemesis still remains a possibility, but report of 3 months abstinence certainly brings diagnoses into consideration.  Zhara Gieske E. Tomasa Rand, MD

## 2023-02-24 ENCOUNTER — Other Ambulatory Visit (HOSPITAL_COMMUNITY): Payer: Self-pay

## 2023-02-25 ENCOUNTER — Telehealth: Payer: Self-pay | Admitting: Pharmacy Technician

## 2023-02-25 NOTE — Telephone Encounter (Signed)
PA has been submitted, and telephone encounter has been created. 

## 2023-02-25 NOTE — Telephone Encounter (Signed)
Pharmacy Patient Advocate Encounter   Received notification from CoverMyMeds that prior authorization for OMEPRAZOLE 40MG  is required/requested.   Insurance verification completed.   The patient is insured through [facility] .   Per test claim: PA required; PA submitted to [facility] via CoverMyMeds Key/confirmation #/EOC [id number] Status is pending

## 2023-03-02 NOTE — Telephone Encounter (Signed)
Pharmacy Patient Advocate Encounter  Received notification from [facility] that Prior Authorization for OMEPRAZOLE has been DENIED.  Full denial letter will be uploaded to the media tab. See denial reason below.   PA #/Case ID/Reference #: [id number]

## 2023-03-02 NOTE — Telephone Encounter (Signed)
Is there an update on this PA?

## 2023-03-03 NOTE — Telephone Encounter (Signed)
This medicaion has been denied by the patient insurance because it can be bought over the counter.

## 2023-04-06 ENCOUNTER — Telehealth: Payer: Self-pay | Admitting: Gastroenterology

## 2023-04-06 NOTE — Telephone Encounter (Signed)
Inbound call from patient requesting to discuss payment options for 11/5 EGD due to insurance being out of network. Would like to discuss out of pocket cost. Please advise, thank you.

## 2023-04-07 ENCOUNTER — Emergency Department (HOSPITAL_COMMUNITY): Payer: BLUE CROSS/BLUE SHIELD

## 2023-04-07 ENCOUNTER — Other Ambulatory Visit: Payer: Self-pay

## 2023-04-07 ENCOUNTER — Encounter (HOSPITAL_COMMUNITY): Payer: Self-pay

## 2023-04-07 ENCOUNTER — Emergency Department (HOSPITAL_COMMUNITY)
Admission: EM | Admit: 2023-04-07 | Discharge: 2023-04-07 | Disposition: A | Discharge status: Home / Self Care | Payer: BLUE CROSS/BLUE SHIELD | Attending: Emergency Medicine | Admitting: Emergency Medicine

## 2023-04-07 DIAGNOSIS — Z21 Asymptomatic human immunodeficiency virus [HIV] infection status: Secondary | ICD-10-CM | POA: Diagnosis not present

## 2023-04-07 DIAGNOSIS — R112 Nausea with vomiting, unspecified: Secondary | ICD-10-CM | POA: Diagnosis present

## 2023-04-07 LAB — CBC WITH DIFFERENTIAL/PLATELET
Abs Immature Granulocytes: 0.05 10*3/uL (ref 0.00–0.07)
Basophils Absolute: 0 10*3/uL (ref 0.0–0.1)
Basophils Relative: 0 %
Eosinophils Absolute: 0 10*3/uL (ref 0.0–0.5)
Eosinophils Relative: 0 %
HCT: 45.2 % (ref 39.0–52.0)
Hemoglobin: 14.9 g/dL (ref 13.0–17.0)
Immature Granulocytes: 1 %
Lymphocytes Relative: 20 %
Lymphs Abs: 1.9 10*3/uL (ref 0.7–4.0)
MCH: 25.1 pg — ABNORMAL LOW (ref 26.0–34.0)
MCHC: 33 g/dL (ref 30.0–36.0)
MCV: 76.2 fL — ABNORMAL LOW (ref 80.0–100.0)
Monocytes Absolute: 0.4 10*3/uL (ref 0.1–1.0)
Monocytes Relative: 4 %
Neutro Abs: 7.2 10*3/uL (ref 1.7–7.7)
Neutrophils Relative %: 75 %
Platelets: 261 10*3/uL (ref 150–400)
RBC: 5.93 MIL/uL — ABNORMAL HIGH (ref 4.22–5.81)
RDW: 13.2 % (ref 11.5–15.5)
WBC: 9.6 10*3/uL (ref 4.0–10.5)
nRBC: 0 % (ref 0.0–0.2)

## 2023-04-07 LAB — URINALYSIS, ROUTINE W REFLEX MICROSCOPIC
Bilirubin Urine: NEGATIVE
Glucose, UA: NEGATIVE mg/dL
Hgb urine dipstick: NEGATIVE
Ketones, ur: 80 mg/dL — AB
Leukocytes,Ua: NEGATIVE
Nitrite: NEGATIVE
Protein, ur: 100 mg/dL — AB
Specific Gravity, Urine: >1.046 — ABNORMAL HIGH (ref 1.005–1.030)
pH: 6 (ref 5.0–8.0)

## 2023-04-07 LAB — COMPREHENSIVE METABOLIC PANEL
ALT: 42 U/L (ref 0–44)
AST: 47 U/L — ABNORMAL HIGH (ref 15–41)
Albumin: 4.7 g/dL (ref 3.5–5.0)
Alkaline Phosphatase: 63 U/L (ref 38–126)
Anion gap: 17 — ABNORMAL HIGH (ref 5–15)
BUN: 15 mg/dL (ref 6–20)
CO2: 20 mmol/L — ABNORMAL LOW (ref 22–32)
Calcium: 10.2 mg/dL (ref 8.9–10.3)
Chloride: 100 mmol/L (ref 98–111)
Creatinine, Ser: 1.34 mg/dL — ABNORMAL HIGH (ref 0.61–1.24)
GFR, Estimated: >60 mL/min (ref >60)
Glucose, Bld: 158 mg/dL — ABNORMAL HIGH (ref 70–99)
Potassium: 2.9 mmol/L — ABNORMAL LOW (ref 3.5–5.1)
Sodium: 137 mmol/L (ref 135–145)
Total Bilirubin: 1.9 mg/dL — ABNORMAL HIGH (ref 0.3–1.2)
Total Protein: 9.3 g/dL — ABNORMAL HIGH (ref 6.5–8.1)

## 2023-04-07 LAB — RAPID URINE DRUG SCREEN, HOSP PERFORMED
Amphetamines: NOT DETECTED
Barbiturates: NOT DETECTED
Benzodiazepines: NOT DETECTED
Cocaine: NOT DETECTED
Opiates: NOT DETECTED
Tetrahydrocannabinol: POSITIVE — AB

## 2023-04-07 LAB — LIPASE, BLOOD: Lipase: 22 U/L (ref 11–51)

## 2023-04-07 MED ORDER — SODIUM CHLORIDE 0.9 % IV SOLN
12.5000 mg | Freq: Four times a day (QID) | INTRAVENOUS | Status: DC | PRN
  Administered 2023-04-07: 12.5 mg via INTRAVENOUS
  Filled 2023-04-07: qty 12.5

## 2023-04-07 MED ORDER — SODIUM CHLORIDE 0.9 % IV BOLUS
1000.0000 mL | Freq: Once | INTRAVENOUS | Status: AC
  Administered 2023-04-07: 1000 mL via INTRAVENOUS

## 2023-04-07 MED ORDER — ONDANSETRON HCL 4 MG/2ML IJ SOLN
4.0000 mg | Freq: Once | INTRAMUSCULAR | Status: AC
  Administered 2023-04-07: 4 mg via INTRAVENOUS
  Filled 2023-04-07: qty 2

## 2023-04-07 MED ORDER — IOHEXOL 350 MG/ML SOLN
75.0000 mL | Freq: Once | INTRAVENOUS | Status: AC | PRN
  Administered 2023-04-07: 75 mL via INTRAVENOUS

## 2023-04-07 MED ORDER — ONDANSETRON 4 MG PO TBDP: 4.0000 mg | ORAL_TABLET | Freq: Three times a day (TID) | ORAL | 0 refills | Status: DC | PRN

## 2023-04-07 NOTE — ED Notes (Signed)
Pharmacy messaged for medication.

## 2023-04-07 NOTE — Discharge Instructions (Signed)
Follow-up with your primary care provider.  Return for any concerning symptoms.  Have sent Zofran into the pharmacy for you.

## 2023-04-07 NOTE — ED Notes (Signed)
Patient transported to CT 

## 2023-04-07 NOTE — ED Provider Notes (Signed)
[facility] AT [facility] Provider Note   CSN: [id number] Arrival date & time: 04/07/23  0426     History  Chief Complaint  Patient presents with   Vomiting    Matthew Petersen is a 22 y.o. male.  Patient presents to the emergency department complaining of intractable nausea and vomiting which is been ongoing for 1 week.  Patient is a history of this occurring multiple times throughout the past few years.  He has been evaluated by gastroenterology with no clear diagnosis at this time.  The patient does endorse using marijuana but states that he stopped this for a 23-month.  He continued to have symptoms before resuming it.  He routinely has nausea once a week.  He also endorses generalized abdominal pain.  Patient denies diarrhea, chest pain, shortness of breath, fever.  Upon arrival patient is tachycardic with a rate of 142.  Past medical history significant for HIV, intractable vomiting, cannabis hyperemesis syndrome.  Patient takes Symtuza for his HIV  HPI     Home Medications Prior to Admission medications   Medication Sig Start Date End Date Taking? Authorizing Provider  Darunavir-Cobicistat-Emtricitabine-Tenofovir Alafenamide (SYMTUZA) 800-150-200-10 MG TABS Take 1 tablet by mouth daily with breakfast. 06/30/22   Blanchard Kelch, NP  omeprazole (PRILOSEC) 40 MG capsule Take 1 capsule (40 mg total) by mouth daily. 02/17/23   Unk Lightning, PA      Allergies    Haldol [haloperidol]    Review of Systems   Review of Systems  Physical Exam Updated Vital Signs BP (!) 147/81   Pulse 86   Temp 97.9 F (36.6 C)   Resp 11   Ht 5\' 8"  (1.727 m)   Wt 74.8 kg   SpO2 100%   BMI 25.09 kg/m  Physical Exam Vitals and nursing note reviewed.  Constitutional:      General: He is in acute distress.     Appearance: He is well-developed.  HENT:     Head: Normocephalic and atraumatic.  Eyes:     Conjunctiva/sclera: Conjunctivae normal.   Cardiovascular:     Rate and Rhythm: Regular rhythm. Tachycardia present.     Heart sounds: No murmur heard. Pulmonary:     Effort: Pulmonary effort is normal. No respiratory distress.     Breath sounds: Normal breath sounds.  Abdominal:     Palpations: Abdomen is soft.     Tenderness: There is abdominal tenderness (Generalized).  Musculoskeletal:        General: No swelling.     Cervical back: Neck supple.  Skin:    General: Skin is warm and dry.     Capillary Refill: Capillary refill takes less than 2 seconds.  Neurological:     Mental Status: He is alert.  Psychiatric:        Mood and Affect: Mood normal.     ED Results / Procedures / Treatments   Labs (all labs ordered are listed, but only abnormal results are displayed) Labs Reviewed  COMPREHENSIVE METABOLIC PANEL - Abnormal; Notable for the following components:      Result Value   Potassium 2.9 (*)    CO2 20 (*)    Glucose, Bld 158 (*)    Creatinine, Ser 1.34 (*)    Total Protein 9.3 (*)    AST 47 (*)    Total Bilirubin 1.9 (*)    Anion gap 17 (*)    All other components within normal limits  CBC WITH  DIFFERENTIAL/PLATELET - Abnormal; Notable for the following components:   RBC 5.93 (*)    MCV 76.2 (*)    MCH 25.1 (*)    All other components within normal limits  LIPASE, BLOOD  URINALYSIS, ROUTINE W REFLEX MICROSCOPIC  RAPID URINE DRUG SCREEN, HOSP PERFORMED    EKG None  Radiology No results found.  Procedures Procedures    Medications Ordered in ED Medications  promethazine (PHENERGAN) 12.5 mg in sodium chloride 0.9 % 50 mL IVPB (has no administration in time range)  ondansetron (ZOFRAN) injection 4 mg (4 mg Intravenous Given 04/07/23 0506)  sodium chloride 0.9 % bolus 1,000 mL (1,000 mLs Intravenous New Bag/Given 04/07/23 0531)    ED Course/ Medical Decision Making/ A&P                                 Medical Decision Making Amount and/or Complexity of Data Reviewed Labs:  ordered. Radiology: ordered.  Risk Prescription drug management.   This patient presents to the ED for concern of abdominal pain with nausea vomiting, this involves an extensive number of treatment options, and is a complaint that carries with it a high risk of complications and morbidity.  The differential diagnosis includes cannabinoid hyperemesis, gastroparesis, appendicitis, cholecystitis, others   Co morbidities that complicate the patient evaluation  HIV, cannabinoid hyperemesis syndrome   Additional history obtained:  Additional history obtained from family bedside External records from outside source obtained and reviewed including gastroenterology notes considering possible cannabinoid hyperemesis syndrome as root cause of patient's symptoms   Lab Tests:  I Ordered, and personally interpreted labs.  The pertinent results include: Potassium 2.9, creatinine 1.34, total bilirubin 1.9, anion gap 17, lipase 22   Imaging Studies ordered:  I ordered imaging studies including CT abdomen pelvis with contrast Imaging pending at this time   Problem List / ED Course / Critical interventions / Medication management   I ordered medication including Zofran and Phenergan for nausea, saline bolus for fluid resuscitation Reevaluation of the patient after these medicines showed that the patient improved I have reviewed the patients home medicines and have made adjustments as needed   Social Determinants of Health:  Patient has HIV, followed by infectious disease   Test / Admission - Considered:  Patient care being transferred to Marita Kansas, PA-C at shift handoff.  Disposition pending results of imaging and lab work along with reassessment after medication.  If CT scan is reassuring and nausea is able to be controlled, patient may likely discharge home with follow-up with gastroenterology.         Final Clinical Impression(s) / ED Diagnoses Final diagnoses:  None     Rx / DC Orders ED Discharge Orders     None         Pamala Duffel 04/07/23 9528    Tilden Fossa, MD 04/07/23 [phone]

## 2023-04-07 NOTE — ED Notes (Signed)
Pt reports improved symptoms.

## 2023-04-07 NOTE — ED Notes (Signed)
This RN reviewed discharge instructions with patient. He verbalized understanding and denied any further questions. PT well appearing upon discharge and denies pain. Pt ambulated with stable gait to exit. Pt endorses ride home.

## 2023-04-07 NOTE — ED Notes (Signed)
Pt. Given ice pack per request

## 2023-04-07 NOTE — ED Provider Notes (Signed)
Signout received on this 22 year old male who presents with concern for cyclical vomiting syndrome.  Pending CT scan at the time of shift change.  Physical Exam  BP 127/89   Pulse (!) 115   Temp 97.8 F (36.6 C)   Resp 19   Ht 5\' 8"  (1.727 m)   Wt 74.8 kg   SpO2 100%   BMI 25.09 kg/m     Procedures  Procedures  ED Course / MDM    Medical Decision Making Amount and/or Complexity of Data Reviewed Labs: ordered. Radiology: ordered.  Risk Prescription drug management.   CT scan without acute intra-abdominal concern.  P.o. challenge passed.  Patient agreeable to discharge.  Zofran prescribed.  Discharged in stable condition.       Marita Kansas, PA-C 04/07/23 1204    Gerhard Munch, MD 04/08/23 2030

## 2023-04-07 NOTE — ED Triage Notes (Signed)
Nausea / vomiting x 7 days. Says stomach feels like it is in knots.

## 2023-04-20 ENCOUNTER — Encounter: Payer: Self-pay | Admitting: Gastroenterology

## 2023-04-20 ENCOUNTER — Ambulatory Visit: Payer: BLUE CROSS/BLUE SHIELD | Admitting: Gastroenterology

## 2023-04-20 VITALS — BP 122/79 | HR 74 | Temp 99.5°F | Resp 13 | Ht 68.0 in | Wt 169.0 lb

## 2023-04-20 DIAGNOSIS — R112 Nausea with vomiting, unspecified: Secondary | ICD-10-CM | POA: Diagnosis not present

## 2023-04-20 DIAGNOSIS — K2950 Unspecified chronic gastritis without bleeding: Secondary | ICD-10-CM

## 2023-04-20 MED ORDER — SODIUM CHLORIDE 0.9 % IV SOLN: 500.0000 mL | INTRAVENOUS | Status: DC

## 2023-04-20 NOTE — Progress Notes (Signed)
Sedate, gd SR, tolerated procedure well, VSS, report to RN 

## 2023-04-20 NOTE — Patient Instructions (Signed)
  Resume previous diet  Continue present medications  Awaiting pathology results   YOU HAD AN ENDOSCOPIC PROCEDURE TODAY AT THE [facility]:   Refer to the procedure report that was given to you for any specific questions about what was found during the examination.  If the procedure report does not answer your questions, please call your gastroenterologist to clarify.  If you requested that your care partner not be given the details of your procedure findings, then the procedure report has been included in a sealed envelope for you to review at your convenience later.  YOU SHOULD EXPECT: Some feelings of bloating in the abdomen. Passage of more gas than usual.  Walking can help get rid of the air that was put into your GI tract during the procedure and reduce the bloating. If you had a lower endoscopy (such as a colonoscopy or flexible sigmoidoscopy) you may notice spotting of blood in your stool or on the toilet paper. If you underwent a bowel prep for your procedure, you may not have a normal bowel movement for a few days.  Please Note:  You might notice some irritation and congestion in your nose or some drainage.  This is from the oxygen used during your procedure.  There is no need for concern and it should clear up in a day or so.  SYMPTOMS TO REPORT IMMEDIATELY:  Following upper endoscopy (EGD)  Vomiting of blood or coffee ground material  New chest pain or pain under the shoulder blades  Painful or persistently difficult swallowing  New shortness of breath  Fever of 100F or higher  Black, tarry-looking stools  For urgent or emergent issues, a gastroenterologist can be reached at any hour by calling [phone]. Do not use MyChart messaging for urgent concerns.    DIET:  We do recommend a small meal at first, but then you may proceed to your regular diet.  Drink plenty of fluids but you should avoid alcoholic beverages for 24 hours.  ACTIVITY:  You should plan to  take it easy for the rest of today and you should NOT DRIVE or use heavy machinery until tomorrow (because of the sedation medicines used during the test).    FOLLOW UP: Our staff will call the number listed on your records the next business day following your procedure.  We will call around 7:15- 8:00 am to check on you and address any questions or concerns that you may have regarding the information given to you following your procedure. If we do not reach you, we will leave a message.     If any biopsies were taken you will be contacted by phone or by letter within the next 1-3 weeks.  Please call us at [phone] if you have not heard about the biopsies in 3 weeks.    SIGNATURES/CONFIDENTIALITY: You and/or your care partner have signed paperwork which will be entered into your electronic medical record.  These signatures attest to the fact that that the information above on your After Visit Summary has been reviewed and is understood.  Full responsibility of the confidentiality of this discharge information lies with you and/or your care-partner.

## 2023-04-20 NOTE — Op Note (Signed)
[facility] Patient Name: Matthew Petersen Procedure Date: 04/20/2023 9:54 AM MRN: [id number] Endoscopist: Lorin Picket E. Tomasa Rand , MD, [id number] Age: 22 Referring MD:  Date of Birth: 08-Aug-2000 Gender: Male Account #: [id number] Procedure:                Upper GI endoscopy Indications:              Nausea with vomiting Medicines:                Monitored Anesthesia Care Procedure:                Pre-Anesthesia Assessment:                           - Prior to the procedure, a History and Physical                            was performed, and patient medications and                            allergies were reviewed. The patient's tolerance of                            previous anesthesia was also reviewed. The risks                            and benefits of the procedure and the sedation                            options and risks were discussed with the patient.                            All questions were answered, and informed consent                            was obtained. Prior Anticoagulants: The patient has                            taken no anticoagulant or antiplatelet agents. ASA                            Grade Assessment: II - A patient with mild systemic                            disease. After reviewing the risks and benefits,                            the patient was deemed in satisfactory condition to                            undergo the procedure.                           After obtaining informed consent, the endoscope was  passed under direct vision. Throughout the                            procedure, the patient's blood pressure, pulse, and                            oxygen saturations were monitored continuously. The                            GIF [id number] #[id number] was introduced through the                            mouth, and advanced to the second part of duodenum.                            The upper GI endoscopy was  accomplished without                            difficulty. The patient tolerated the procedure                            well. Scope In: Scope Out: Findings:                 The examined portions of the nasopharynx,                            oropharynx and larynx were normal.                           The examined esophagus was normal.                           Localized moderately erythematous mucosa without                            bleeding was found on the lesser curvature of the                            stomach. Biopsies were taken with a cold forceps                            for histology. Estimated blood loss was minimal.                           The exam of the stomach was otherwise normal.                           Biopsies were taken with a cold forceps in the                            gastric antrum for Helicobacter pylori testing.                            Estimated blood loss was minimal.  The examined duodenum was normal. Complications:            No immediate complications. Estimated Blood Loss:     Estimated blood loss was minimal. Impression:               - The examined portions of the nasopharynx,                            oropharynx and larynx were normal.                           - Normal esophagus.                           - Erythematous mucosa in the lesser curvature.                            Biopsied. Suspect these changes are secondary to                            repetitive vomiting, not a cause of it.                           - Normal examined duodenum.                           - Biopsies were taken with a cold forceps for                            Helicobacter pylori testing.                           - Suspect patient has cyclic vomiting syndrome Recommendation:           - Patient has a contact number available for                            emergencies. The signs and symptoms of potential                             delayed complications were discussed with the                            patient. Return to normal activities tomorrow.                            Written discharge instructions were provided to the                            patient.                           - Resume previous diet.                           - Continue present medications.                           -  Await pathology results.                           - Return to my office at the next available                            appointment to discuss management of [facility]. Kayton Ripp E. Tomasa Rand, MD 04/20/2023 10:18:23 AM This report has been signed electronically.

## 2023-04-20 NOTE — Progress Notes (Signed)
Addendum created: sent work note to pt via MyChart.

## 2023-04-20 NOTE — Progress Notes (Signed)
Pt's states no medical or surgical changes since previsit or office visit. 

## 2023-04-20 NOTE — Progress Notes (Signed)
[facility] History and Physical   Primary Care Physician:  System, Provider Not In   Reason for Procedure:   Nausea, vomiting  Plan:    EGD     HPI: Matthew Petersen is a 22 y.o. male undergoing EGD to evaluate chronic nausea and vomiting symptoms.   Symptoms typically last for up to 2 weeks at a time and are becoming more frequent (about 1 episode a month).  No improvement with marijuana cessation.  Past Medical History:  Diagnosis Date   HIV (human immunodeficiency virus infection) (HCC)     History reviewed. No pertinent surgical history.  Prior to Admission medications   Medication Sig Start Date End Date Taking? Authorizing Provider  Darunavir-Cobicistat-Emtricitabine-Tenofovir Alafenamide (SYMTUZA) 800-150-200-10 MG TABS Take 1 tablet by mouth daily with breakfast. 06/30/22  Yes Dixon, Gomez Cleverly, NP  ondansetron (ZOFRAN-ODT) 4 MG disintegrating tablet Take 1 tablet (4 mg total) by mouth every 8 (eight) hours as needed. 04/07/23  Yes Karie Mainland, Amjad, PA-C  omeprazole (PRILOSEC) 40 MG capsule Take 1 capsule (40 mg total) by mouth daily. 02/17/23   Unk Lightning, PA    Current Outpatient Medications  Medication Sig Dispense Refill   Darunavir-Cobicistat-Emtricitabine-Tenofovir Alafenamide (SYMTUZA) 800-150-200-10 MG TABS Take 1 tablet by mouth daily with breakfast. 30 tablet 0   ondansetron (ZOFRAN-ODT) 4 MG disintegrating tablet Take 1 tablet (4 mg total) by mouth every 8 (eight) hours as needed. 20 tablet 0   omeprazole (PRILOSEC) 40 MG capsule Take 1 capsule (40 mg total) by mouth daily. 30 capsule 2   Current Facility-Administered Medications  Medication Dose Route Frequency Provider Last Rate Last Admin   0.9 %  sodium chloride infusion  500 mL Intravenous Continuous Jenel Lucks, MD        Allergies as of 04/20/2023 - Review Complete 04/20/2023  Allergen Reaction Noted   Haldol [haloperidol]  09/12/2019    Family History  Problem Relation  Age of Onset   Diabetes Mother    Multiple sclerosis Father    Colon cancer Neg Hx    Esophageal cancer Neg Hx    Rectal cancer Neg Hx    Stomach cancer Neg Hx     Social History   Socioeconomic History   Marital status: Single    Spouse name: Not on file   Number of children: 0   Years of education: 13   Highest education level: Not on file  Occupational History   Occupation: Unemployed     Comment: Clinical cytogeneticist at [employer]  Tobacco Use   Smoking status: Never   Smokeless tobacco: Never  Vaping Use   Vaping status: Never Used  Substance and Sexual Activity   Alcohol use: Yes    Comment: occasionally   Drug use: Yes    Frequency: 7.0 times per week    Types: Marijuana    Comment: last used 3.5 weeks   Sexual activity: Not Currently    Comment: accepted condoms  Other Topics Concern   Not on file  Social History Narrative   Not on file   Social Determinants of Health   Financial Resource Strain: Not on file  Food Insecurity: Not on file  Transportation Needs: Not on file  Physical Activity: Not on file  Stress: Not on file  Social Connections: Not on file  Intimate Partner Violence: Not on file    Review of Systems:  All other review of systems negative except as mentioned in the HPI.  Physical Exam: Vital signs  BP 117/62   Pulse 85   Temp 99.5 F (37.5 C)   Ht 5\' 8"  (1.727 m)   Wt 169 lb (76.7 kg)   SpO2 99%   BMI 25.70 kg/m   General:   Alert,  Well-developed, well-nourished, pleasant and cooperative in NAD Airway:  Mallampati 1 Lungs:  Clear throughout to auscultation.   Heart:  Regular rate and rhythm; no murmurs, clicks, rubs,  or gallops. Abdomen:  Soft, nontender and nondistended. Normal bowel sounds.   Neuro/Psych:  Normal mood and affect. A and O x 3   Marijose Curington E. Tomasa Rand, MD [facility]

## 2023-04-20 NOTE — Progress Notes (Signed)
Called to room to assist during endoscopic procedure.  Patient ID and intended procedure confirmed with present staff. Received instructions for my participation in the procedure from the performing physician.  

## 2023-04-21 ENCOUNTER — Telehealth: Payer: Self-pay

## 2023-04-21 NOTE — Telephone Encounter (Signed)
  Follow up Call-     04/20/2023    8:51 AM  Call back number  Post procedure Call Back phone  # [phone]  Permission to leave phone message Yes     Patient questions:  Do you have a fever, pain , or abdominal swelling? No. Pain Score  0 *  Have you tolerated food without any problems? Yes.    Have you been able to return to your normal activities? Yes.    Do you have any questions about your discharge instructions: Diet   No. Medications  No. Follow up visit  No.  Do you have questions or concerns about your Care? No.  Actions: * If pain score is 4 or above: No action needed, pain <4.

## 2023-04-22 ENCOUNTER — Ambulatory Visit: Payer: BLUE CROSS/BLUE SHIELD | Admitting: Family

## 2023-04-23 LAB — SURGICAL PATHOLOGY

## 2023-04-24 NOTE — Progress Notes (Signed)
Matthew Petersen, The biopsies taken from your stomach were notable for mild chronic gastritis (inflammation) which is a common finding, but there was no evidence of Helicobacter pylori infection. This common finding is not likely to explain your symptoms and there is no specific treatment or further evaluation recommended. As discussed, I think you most likely have cyclic vomiting syndrome.  Please make an office visit to discuss this diagnosis and treatment further.

## 2023-07-06 ENCOUNTER — Ambulatory Visit: Payer: BLUE CROSS/BLUE SHIELD | Admitting: Gastroenterology

## 2023-07-06 NOTE — Progress Notes (Deleted)
HPI : Matthew Petersen is a 23 y.o. male with a history of HIV who presents for follow-up of chronic nausea and vomiting.  He was last seen in our office by Hyacinth Meeker in September.  He states that he had been abstinent from marijuana for 3 months, but his symptoms had not improved.  He was taking omeprazole without improvement.  He underwent an upper endoscopy in November 2024 which showed focal erythema along the lesser curvature of the body, thought to be secondary to recurrent retching.  Biopsies of the antrum and body showed mild chronic gastritis without H. pylori.  He has had numerous ER visits for nausea and vomiting since 2019, most recently in October 2024.    EGD Nov 2024 Focal erythema along the lesser curvature, otherwise normal EGD. Biopsies of antrum and body with mild chronic gastritis, H. pylori negative.  CT Apr 07, 2023 IMPRESSION: No acute abdominopelvic findings  Past Medical History:  Diagnosis Date   HIV (human immunodeficiency virus infection) (HCC)      No past surgical history on file. Family History  Problem Relation Age of Onset   Diabetes Mother    Multiple sclerosis Father    Colon cancer Neg Hx    Esophageal cancer Neg Hx    Rectal cancer Neg Hx    Stomach cancer Neg Hx    Social History   Tobacco Use   Smoking status: Never   Smokeless tobacco: Never  Vaping Use   Vaping status: Never Used  Substance Use Topics   Alcohol use: Yes    Comment: occasionally   Drug use: Yes    Frequency: 7.0 times per week    Types: Marijuana    Comment: last used 3.5 weeks   Current Outpatient Medications  Medication Sig Dispense Refill   Darunavir-Cobicistat-Emtricitabine-Tenofovir Alafenamide (SYMTUZA) 800-150-200-10 MG TABS Take 1 tablet by mouth daily with breakfast. 30 tablet 0   omeprazole (PRILOSEC) 40 MG capsule Take 1 capsule (40 mg total) by mouth daily. 30 capsule 2   ondansetron (ZOFRAN-ODT) 4 MG disintegrating tablet Take 1 tablet (4 mg  total) by mouth every 8 (eight) hours as needed. 20 tablet 0   No current facility-administered medications for this visit.   Allergies  Allergen Reactions   Haldol [Haloperidol]     Dystonic reaction     Review of Systems: All systems reviewed and negative except where noted in HPI.    No results found.  Physical Exam: There were no vitals taken for this visit. Constitutional: Pleasant,well-developed, ***male in no acute distress. HEENT: Normocephalic and atraumatic. Conjunctivae are normal. No scleral icterus. Neck supple.  Cardiovascular: Normal rate, regular rhythm.  Pulmonary/chest: Effort normal and breath sounds normal. No wheezing, rales or rhonchi. Abdominal: Soft, nondistended, nontender. Bowel sounds active throughout. There are no masses palpable. No hepatomegaly. Extremities: no edema Lymphadenopathy: No cervical adenopathy noted. Neurological: Alert and oriented to person place and time. Skin: Skin is warm and dry. No rashes noted. Psychiatric: Normal mood and affect. Behavior is normal.  CBC    Component Value Date/Time   WBC 9.6 04/07/2023 0438   RBC 5.93 (H) 04/07/2023 0438   HGB 14.9 04/07/2023 0438   HGB 13.6 03/17/2021 1428   HCT 45.2 04/07/2023 0438   HCT 43.4 03/17/2021 1428   PLT 261 04/07/2023 0438   PLT 201 03/17/2021 1428   MCV 76.2 (L) 04/07/2023 0438   MCV 84 03/17/2021 1428   MCH 25.1 (L) 04/07/2023 0438   MCHC  33.0 04/07/2023 0438   RDW 13.2 04/07/2023 0438   RDW 13.7 03/17/2021 1428   LYMPHSABS 1.9 04/07/2023 0438   LYMPHSABS 3.1 03/17/2021 1428   MONOABS 0.4 04/07/2023 0438   EOSABS 0.0 04/07/2023 0438   EOSABS 0.0 03/17/2021 1428   BASOSABS 0.0 04/07/2023 0438   BASOSABS 0.0 03/17/2021 1428    CMP     Component Value Date/Time   NA 137 04/07/2023 0438   NA 142 07/19/2018 1142   K 2.9 (L) 04/07/2023 0438   CL 100 04/07/2023 0438   CO2 20 (L) 04/07/2023 0438   GLUCOSE 158 (H) 04/07/2023 0438   BUN 15 04/07/2023 0438    BUN 8 07/19/2018 1142   CREATININE 1.34 (H) 04/07/2023 0438   CREATININE 0.84 03/17/2021 1438   CALCIUM 10.2 04/07/2023 0438   PROT 9.3 (H) 04/07/2023 0438   PROT 7.1 07/19/2018 1142   ALBUMIN 4.7 04/07/2023 0438   ALBUMIN 4.7 07/19/2018 1142   AST 47 (H) 04/07/2023 0438   ALT 42 04/07/2023 0438   ALKPHOS 63 04/07/2023 0438   BILITOT 1.9 (H) 04/07/2023 0438   BILITOT 1.0 07/19/2018 1142   GFRNONAA >60 04/07/2023 0438   GFRNONAA 102 08/08/2020 1114   GFRAA 118 08/08/2020 1114       Latest Ref Rng & Units 04/07/2023    4:38 AM 11/12/2022   11:08 PM 06/22/2022   12:05 PM  CBC EXTENDED  WBC 4.0 - 10.5 K/uL 9.6  6.1    RBC 4.22 - 5.81 MIL/uL 5.93  6.07    Hemoglobin 13.0 - 17.0 g/dL 23.7  62.8  31.5   HCT 39.0 - 52.0 % 45.2  48.7  57.0   Platelets 150 - 400 K/uL 261  265    NEUT# 1.7 - 7.7 K/uL 7.2     Lymph# 0.7 - 4.0 K/uL 1.9         ASSESSMENT AND PLAN:  No ref. provider found

## 2023-07-20 ENCOUNTER — Telehealth: Payer: Self-pay

## 2023-07-20 NOTE — Telephone Encounter (Signed)
 Patient considered out of care.  Last [facility] Visit: 06/30/22  Last HIV Viral Load:  HIV 1 RNA Quant  Date Value Ref Range Status  11/11/2021 43 (H) NOT DETECTED copies/mL Final    Last CD4 Count:  CD4 T Cell Abs  Date Value Ref Range Status  11/11/2021 675 400 - 1,790 /uL Final    Medication Dispense History:   Dispensed Days Supply Quantity Provider Pharmacy  bictegravir-emtricitabine -tenofovir  AF (BIKTARVY ) 50-200-25 MG TABS tablet 06/16/2022 30 30 tablet Calone, Gregory D, FNP [place]  BIKTARVY  50-200-25 MG TABLET 11/11/2021 30 30 each Melvenia Corean SAILOR, NP [facility] [phone] - [facility]...  bictegravir-emtricitabine -tenofovir  AF (BIKTARVY ) 50-200-25 MG TABS tablet 03/06/2021 30 30 tablet Calone, Gregory D, FNP [place] [facility]  bictegravir-emtricitabine -tenofovir  AF (BIKTARVY ) 50-200-25 MG TABS tablet 11/22/2020 30 30 tablet Calone, Gregory D, FNP [place] [facility]  bictegravir-emtricitabine -tenofovir  AF (BIKTARVY ) 50-200-25 MG TABS tablet 10/09/2020 30 30 tablet Calone, Gregory D, FNP [place] [facility]  BIKTARVY  50-200-25 MG TABS 08/05/2020 30 30 each CALONE,GREGORY D [facility]...    Interventions: Called cell number, unable to complete call. Called home number and left HIPAA compliant voicemail requesting callback. MyChart message sent.   Alasia Enge D Dekota Shenk, RN

## 2023-07-31 ENCOUNTER — Ambulatory Visit (HOSPITAL_COMMUNITY)
Admission: EM | Admit: 2023-07-31 | Discharge: 2023-07-31 | Disposition: A | Discharge status: Home / Self Care | Payer: Self-pay | Attending: Emergency Medicine | Admitting: Emergency Medicine

## 2023-07-31 ENCOUNTER — Encounter (HOSPITAL_COMMUNITY): Payer: Self-pay

## 2023-07-31 DIAGNOSIS — R369 Urethral discharge, unspecified: Secondary | ICD-10-CM

## 2023-07-31 MED ORDER — DOXYCYCLINE HYCLATE 100 MG PO CAPS: 100.0000 mg | ORAL_CAPSULE | Freq: Two times a day (BID) | ORAL | 0 refills | Status: DC

## 2023-07-31 MED ORDER — CEFTRIAXONE SODIUM 1 G IJ SOLR
0.5000 g | Freq: Once | INTRAMUSCULAR | Status: AC
  Administered 2023-07-31: 0.5 g via INTRAMUSCULAR

## 2023-07-31 MED ORDER — LIDOCAINE HCL (PF) 1 % IJ SOLN
INTRAMUSCULAR | Status: AC
  Filled 2023-07-31: qty 2

## 2023-07-31 MED ORDER — CEFTRIAXONE SODIUM 500 MG IJ SOLR
INTRAMUSCULAR | Status: AC
  Filled 2023-07-31: qty 500

## 2023-07-31 NOTE — ED Provider Notes (Signed)
[facility]    CSN: [id number] Arrival date & time: 07/31/23  1049      History   Chief Complaint Chief Complaint  Patient presents with   Penile Discharge    HPI Matthew Petersen is a 23 y.o. male.   Patient presents to clinic complaining of a white penile discharge that he noticed 2 days ago.  Initially was having some discomfort with urination and then yesterday he noticed the discharge.  He has been sexually active unprotected with a continued partner.  He does have HIV and follows with infectious disease.  Reports a history of syphilis, has not been retested within the last year or so.  No fevers.  No penile sores or lesions.  No scrotal pain.  The history is provided by the patient and medical records.  Penile Discharge    Past Medical History:  Diagnosis Date   HIV (human immunodeficiency virus infection) (HCC)     Patient Active Problem List   Diagnosis Date Noted   Chronic nausea 06/30/2022   Grief 04/24/2020   Cannabis hyperemesis syndrome concurrent with and due to cannabis abuse (HCC) 09/08/2018   Screening for STDs (sexually transmitted diseases) 09/08/2018   Marijuana use 08/10/2018   HIV disease (HCC) 07/21/2018   Erectile dysfunction 07/19/2018   Intractable vomiting 02/22/2018   Mood disorder of depressed type 12/29/2017   Loss of weight 12/29/2017    History reviewed. No pertinent surgical history.     Home Medications    Prior to Admission medications   Medication Sig Start Date End Date Taking? Authorizing Provider  doxycycline (VIBRAMYCIN) 100 MG capsule Take 1 capsule (100 mg total) by mouth 2 (two) times daily. 07/31/23  Yes Rinaldo Ratel, [place] N, FNP  Darunavir-Cobicistat-Emtricitabine-Tenofovir Alafenamide [facility]) 800-150-200-10 MG TABS Take 1 tablet by mouth daily with breakfast. 06/30/22   Blanchard Kelch, NP  omeprazole (PRILOSEC) 40 MG capsule Take 1 capsule (40 mg total) by mouth daily. 02/17/23   Unk Lightning, PA    Family History Family History  Problem Relation Age of Onset   Diabetes Mother    Multiple sclerosis Father    Colon cancer Neg Hx    Esophageal cancer Neg Hx    Rectal cancer Neg Hx    Stomach cancer Neg Hx     Social History Social History   Tobacco Use   Smoking status: Never   Smokeless tobacco: Never  Vaping Use   Vaping status: Never Used  Substance Use Topics   Alcohol use: Yes    Comment: occasionally   Drug use: Yes    Frequency: 7.0 times per week    Types: Marijuana    Comment: last used 3.5 weeks     Allergies   Haldol [haloperidol]   Review of Systems Review of Systems  Per HPI   Physical Exam Triage Vital Signs ED Triage Vitals  Encounter Vitals Group     BP 07/31/23 1202 116/76     Systolic BP Percentile --      Diastolic BP Percentile --      Pulse Rate 07/31/23 1202 80     Resp 07/31/23 1202 16     Temp 07/31/23 1202 98.1 F (36.7 C)     Temp Source 07/31/23 1202 Oral     SpO2 07/31/23 1202 97 %     Weight 07/31/23 1201 167 lb (75.8 kg)     Height 07/31/23 1201 5\' 9"  (1.753 m)     Head Circumference --  Peak Flow --      Pain Score 07/31/23 1201 5     Pain Loc --      Pain Education --      Exclude from Growth Chart --    No data found.  Updated Vital Signs BP 116/76 (BP Location: Left Arm)   Pulse 80   Temp 98.1 F (36.7 C) (Oral)   Resp 16   Ht 5\' 9"  (1.753 m)   Wt 167 lb (75.8 kg)   SpO2 97%   BMI 24.66 kg/m   Visual Acuity Right Eye Distance:   Left Eye Distance:   Bilateral Distance:    Right Eye Near:   Left Eye Near:    Bilateral Near:     Physical Exam Vitals and nursing note reviewed.  Constitutional:      Appearance: Normal appearance.  HENT:     Head: Normocephalic and atraumatic.     Right Ear: External ear normal.     Left Ear: External ear normal.     Nose: Nose normal.     Mouth/Throat:     Mouth: Mucous membranes are moist.  Eyes:     Conjunctiva/sclera: Conjunctivae  normal.  Cardiovascular:     Rate and Rhythm: Normal rate.  Pulmonary:     Effort: Pulmonary effort is normal. No respiratory distress.  Musculoskeletal:        General: Normal range of motion.     Cervical back: Normal range of motion.  Neurological:     General: No focal deficit present.     Mental Status: He is alert.  Psychiatric:        Mood and Affect: Mood normal.      UC Treatments / Results  Labs (all labs ordered are listed, but only abnormal results are displayed) Labs Reviewed  CYTOLOGY, (ORAL, ANAL, URETHRAL) ANCILLARY ONLY    EKG   Radiology No results found.  Procedures Procedures (including critical care time)  Medications Ordered in UC Medications  cefTRIAXone (ROCEPHIN) injection 0.5 g (has no administration in time range)    Initial Impression / Assessment and Plan / UC Course  I have reviewed the triage vital signs and the nursing notes.  Pertinent labs & imaging results that were available during my care of the patient were reviewed by me and considered in my medical decision making (see chart for details).  Vitals and triage reviewed, patient is hemodynamically stable.  Unprotected sexual intercourse with a penile discharge.  He does have HIV and is followed with infectious disease.  Patient reports compliance with HIV medication, of note it looks like he has missed a few appointments with ID.  Will treat empirically for gonorrhea with Rocephin and doxycycline for chlamydia.  Cytology swab obtained.  Encouraged compliance with infectious disease.  Offered syphilis screening, patient declined.  Staff will contact if additional treatment is needed based off cytology results.  Plan of care, follow-up care return precautions given, no questions at this time.    Final Clinical Impressions(s) / UC Diagnoses   Final diagnoses:  Penile discharge     Discharge Instructions      We have treated you for gonorrhea today in clinic with a Rocephin  injection.  I have sent doxycycline into your pharmacy to treat chlamydia.  Our staff will contact you if we need to modify your treatment plan based on your swab results.  Abstain from intercourse for the next 2 weeks and please keep your scheduled appointment with infectious disease.  Consider re-testing for syphilis at this appointment.  Return to clinic for any new or urgent symptoms.     ED Prescriptions     Medication Sig Dispense Auth. Provider   doxycycline (VIBRAMYCIN) 100 MG capsule Take 1 capsule (100 mg total) by mouth 2 (two) times daily. 20 capsule Kijuana Ruppel, [place] N, [place]      PDMP not reviewed this encounter.   Arian Murley, [place] N, [place] 07/31/23 [phone]

## 2023-07-31 NOTE — Discharge Instructions (Addendum)
We have treated you for gonorrhea today in clinic with a Rocephin injection.  I have sent doxycycline into your pharmacy to treat chlamydia.  Our staff will contact you if we need to modify your treatment plan based on your swab results.  Abstain from intercourse for the next 2 weeks and please keep your scheduled appointment with infectious disease.  Consider re-testing for syphilis at this appointment.  Return to clinic for any new or urgent symptoms.

## 2023-07-31 NOTE — ED Triage Notes (Signed)
 Patient here today with c/o penile discharge X 2 days.

## 2023-08-02 LAB — CYTOLOGY, (ORAL, ANAL, URETHRAL) ANCILLARY ONLY
Chlamydia: NEGATIVE
Comment: NEGATIVE
Comment: NEGATIVE
Comment: NORMAL
Neisseria Gonorrhea: POSITIVE — AB
Trichomonas: NEGATIVE

## 2023-08-06 ENCOUNTER — Telehealth: Payer: Self-pay

## 2023-08-06 NOTE — Telephone Encounter (Signed)
Called [facility] to schedule appointment, call cannot be completed on either number. Chart comment states not to call anyone other than the patient.   Letter mailed to address on file.   Sandie Ano, RN

## 2023-08-20 NOTE — Progress Notes (Signed)
 [facility] Counselor Referral Notice  Attempts to reach patient to re-engage in ID clinic care have not been successful. Patient is being referred to [facility] Counselor for assistance in re-engaging in care.   Last HIV Viral Load:  HIV 1 RNA Quant  Date Value Ref Range Status  11/11/2021 43 (H) NOT DETECTED copies/mL Final    Last CD4 Count:  CD4 T Cell Abs  Date Value Ref Range Status  11/11/2021 675 400 - 1,790 /uL Final     Last [facility] Visit: 06/30/22   Phone Call Attempts:  Attempt Date  07/21/22  07/20/23  08/06/23    MyChart Attempts:  Attempt Date  07/09/22  07/21/22  03/02/23  04/22/23  07/20/23    Letter Mailed: 08/06/23   Duration of Service: 5 minutes  Sandie Ano, RN

## 2023-09-21 NOTE — Progress Notes (Signed)
 [facility] Referral Notice  Attempts to reach patient to re-engage in ID clinic care have not been successful. Patient is considered out of care from the ID clinic. Patient referred to [facility] Counsel[facility] to confirm or assist in engagement in HIV care.   S[facility] Notes: out of care and frequent no-shows/cancellations  Last HIV Viral Load:  HIV 1 RNA Quant  Date Value Ref Range Status  11/11/2021 43 (H) NOT DETECTED copies/mL Final    Last CD4 Count:  CD4 T Cell Abs  Date Value Ref Range Status  11/11/2021 675 400 - 1,790 /uL Final    Last [facility] Visit: 06/30/22  Phone Call Attempts:  Attempt Date  07/21/22  07/20/23  08/06/23    MyChart Attempts:  Attempt Date  07/09/22  07/21/22  03/02/23  04/22/23  07/20/23    Letter Mailed: 08/06/23  Referred to [facility] Counselor: 08/20/23   Duration of Service: 10 minutes  Linna Hoff, BSN, RN

## 2023-11-09 NOTE — Progress Notes (Unsigned)
 HPI: Matthew Petersen is a 23 y.o. male who presents to the [facility] pharmacy clinic for HIV follow-up.  Patient Active Problem List   Diagnosis Date Noted   Chronic nausea 06/30/2022   Grief 04/24/2020   Cannabis hyperemesis syndrome concurrent with and due to cannabis abuse (HCC) 09/08/2018   Screening for STDs (sexually transmitted diseases) 09/08/2018   Marijuana use 08/10/2018   HIV disease (HCC) 07/21/2018   Erectile dysfunction 07/19/2018   Intractable vomiting 02/22/2018   Mood disorder of depressed type 12/29/2017   Loss of weight 12/29/2017    Patient's Medications  New Prescriptions   No medications on file  Previous Medications   DARUNAVIR-COBICISTAT-EMTRICITABINE -TENOFOVIR  ALAFENAMIDE (SYMTUZA ) 800-150-200-10 MG TABS    Take 1 tablet by mouth daily with breakfast.   DOXYCYCLINE  (VIBRAMYCIN ) 100 MG CAPSULE    Take 1 capsule (100 mg total) by mouth 2 (two) times daily.   OMEPRAZOLE  (PRILOSEC) 40 MG CAPSULE    Take 1 capsule (40 mg total) by mouth daily.  Modified Medications   No medications on file  Discontinued Medications   No medications on file    Labs: Lab Results  Component Value Date   HIV1RNAQUANT 43 (H) 11/11/2021   HIV1RNAQUANT 326 (H) 03/17/2021   HIV1RNAQUANT <20 08/08/2020   CD4TABS 675 11/11/2021   CD4TABS 830 08/08/2020   CD4TABS 775 04/24/2020    RPR and STI Lab Results  Component Value Date   LABRPR NON-REACTIVE 11/11/2021   LABRPR NON-REACTIVE 03/17/2021   LABRPR NON-REACTIVE 04/24/2020   LABRPR REACTIVE (A) 08/30/2019   LABRPR NON-REACTIVE 07/25/2018   RPRTITER 1:1 (H) 08/30/2019    STI Results GC CT  07/31/2023 12:20 PM Positive  Negative   11/11/2021 11:41 AM Negative  Negative   11/11/2021 11:40 AM Positive  Negative   08/30/2019 10:01 AM Negative  Negative   03/02/2019 12:00 AM **POSITIVE**  Negative   07/25/2018 12:00 AM Negative  Negative   03/14/2018 12:00 AM **POSITIVE**    Negative  Negative    Negative    12/22/2017 12:00 AM Negative  Negative   01/20/2017 12:00 AM Negative  Negative     Hepatitis B Lab Results  Component Value Date   HEPBSAB NON-REACTIVE 07/25/2018   HEPBSAG NON-REACTIVE 07/25/2018   HEPBCAB NON-REACTIVE 07/25/2018   Hepatitis C Lab Results  Component Value Date   HEPCAB NON-REACTIVE 07/25/2018   Hepatitis A Lab Results  Component Value Date   HAV REACTIVE (A) 07/25/2018   Lipids: Lab Results  Component Value Date   CHOL 199 03/17/2021   TRIG 122 03/17/2021   HDL 61 03/17/2021   CHOLHDL 3.3 03/17/2021   LDLCALC 115 (H) 03/17/2021    Current HIV Regimen: Symtuza   Assessment: Matthew Petersen presents today for HIV follow up after being out of care. Last visit at [facility] was 06/30/2022 with Matthew Petersen. At that visit, Biktarvy  was switched to Symtuza  due to concerns for Biktarvy  causing nausea and vomiting. Patient felt strongly that nausea/vomiting was related to Biktarvy  which was impacting his adherence. He saw GI in September 2024 and reported the switch to Symtuza  did not help his symptoms. They started him on a PPI and scheduled an endoscopy which did not show any significant findings. Per GI, he likely has cyclic vomiting syndrome, but he no showed follow up visit to discuss diagnosis. The patient has been out of ID care for >1 year despite attempts to contact for scheduling. Fill history shows Biktarvy  last filled January 2024 and Symtuza  not in fill  history.   Eligible for Prevnar 20, Shingrix, and Hep B vaccines today.  Plan:  - Start *** - HIV RNA with genotypic testing, CD4 count, CMP, CBC, lipid panel, RPR, urine/oral cytologies today - Follow up with Matthew Petersen on ***  Matthew Petersen, PharmD PGY-1 Pharmacy Resident

## 2023-11-10 ENCOUNTER — Ambulatory Visit (INDEPENDENT_AMBULATORY_CARE_PROVIDER_SITE_OTHER): Payer: Self-pay | Admitting: Pharmacist

## 2023-11-10 ENCOUNTER — Ambulatory Visit: Payer: Self-pay

## 2023-11-10 ENCOUNTER — Other Ambulatory Visit: Payer: Self-pay

## 2023-11-10 DIAGNOSIS — B20 Human immunodeficiency virus [HIV] disease: Secondary | ICD-10-CM

## 2023-11-10 DIAGNOSIS — Z113 Encounter for screening for infections with a predominantly sexual mode of transmission: Secondary | ICD-10-CM

## 2023-11-10 DIAGNOSIS — Z23 Encounter for immunization: Secondary | ICD-10-CM

## 2023-11-10 MED ORDER — BIKTARVY 50-200-25 MG PO TABS: 1.0000 | ORAL_TABLET | Freq: Every day | ORAL | 1 refills | Status: DC

## 2023-11-11 ENCOUNTER — Ambulatory Visit: Payer: Self-pay | Admitting: Pharmacist

## 2023-11-11 LAB — GC/CHLAMYDIA PROBE, AMP (THROAT)
Chlamydia trachomatis RNA: NOT DETECTED
Neisseria gonorrhoeae RNA: NOT DETECTED

## 2023-11-11 LAB — C. TRACHOMATIS/N. GONORRHOEAE RNA
C. trachomatis RNA, TMA: NOT DETECTED
N. gonorrhoeae RNA, TMA: NOT DETECTED

## 2023-11-11 NOTE — Progress Notes (Signed)
 Excellent - thank you [name]!

## 2023-11-11 NOTE — Progress Notes (Signed)
 Matthew Petersen, not sure if you remember seeing this patient before. See my note for details about missed appointments and history of GI concerns. LFTs are somewhat elevated. Let me know if there is anything I can do to investigate this further or how to proceed. Thanks!

## 2023-11-12 ENCOUNTER — Other Ambulatory Visit: Payer: Self-pay | Admitting: Pharmacist

## 2023-11-12 DIAGNOSIS — B20 Human immunodeficiency virus [HIV] disease: Secondary | ICD-10-CM

## 2023-11-12 LAB — CT/NG RNA, TMA RECTAL
Chlamydia Trachomatis RNA: DETECTED — AB
Neisseria Gonorrhoeae RNA: NOT DETECTED

## 2023-11-12 LAB — CBC
HCT: 38.4 % — ABNORMAL LOW (ref 38.5–50.0)
Hemoglobin: 12.3 g/dL — ABNORMAL LOW (ref 13.2–17.1)
MCH: 25.6 pg — ABNORMAL LOW (ref 27.0–33.0)
MCHC: 32 g/dL (ref 32.0–36.0)
MCV: 80 fL (ref 80.0–100.0)
MPV: 9.8 fL (ref 7.5–12.5)
Platelets: 188 10*3/uL (ref 140–400)
RBC: 4.8 10*6/uL (ref 4.20–5.80)
RDW: 13.8 % (ref 11.0–15.0)
WBC: 3.8 10*3/uL (ref 3.8–10.8)

## 2023-11-12 LAB — HIV-1 RNA QUANT-NO REFLEX-BLD
HIV 1 RNA Quant: 43300 {copies}/mL — ABNORMAL HIGH
HIV-1 RNA Quant, Log: 4.64 {Log_copies}/mL — ABNORMAL HIGH

## 2023-11-12 LAB — COMPREHENSIVE METABOLIC PANEL WITH GFR
AG Ratio: 1.2 (calc) (ref 1.0–2.5)
ALT: 73 U/L — ABNORMAL HIGH (ref 9–46)
AST: 102 U/L — ABNORMAL HIGH (ref 10–40)
Albumin: 4.4 g/dL (ref 3.6–5.1)
Alkaline phosphatase (APISO): 52 U/L (ref 36–130)
BUN: 12 mg/dL (ref 7–25)
CO2: 29 mmol/L (ref 20–32)
Calcium: 9.4 mg/dL (ref 8.6–10.3)
Chloride: 105 mmol/L (ref 98–110)
Creat: 0.93 mg/dL (ref 0.60–1.24)
Globulin: 3.8 g/dL — ABNORMAL HIGH (ref 1.9–3.7)
Glucose, Bld: 87 mg/dL (ref 65–99)
Potassium: 3.5 mmol/L (ref 3.5–5.3)
Sodium: 139 mmol/L (ref 135–146)
Total Bilirubin: 0.7 mg/dL (ref 0.2–1.2)
Total Protein: 8.2 g/dL — ABNORMAL HIGH (ref 6.1–8.1)
eGFR: 118 mL/min/{1.73_m2} (ref >=60)

## 2023-11-12 LAB — LIPID PANEL
Cholesterol: 172 mg/dL (ref <200)
HDL: 48 mg/dL (ref >=40)
LDL Cholesterol (Calc): 105 mg/dL — ABNORMAL HIGH
Non-HDL Cholesterol (Calc): 124 mg/dL (ref <130)
Total CHOL/HDL Ratio: 3.6 (calc) (ref <5.0)
Triglycerides: 93 mg/dL (ref <150)

## 2023-11-12 LAB — HEPATITIS B SURFACE ANTIBODY,QUALITATIVE: Hep B S Ab: REACTIVE — AB

## 2023-11-12 LAB — RPR: RPR Ser Ql: NONREACTIVE

## 2023-11-12 MED ORDER — BICTEGRAVIR-EMTRICITAB-TENOFOV 50-200-25 MG PO TABS: 1.0000 | ORAL_TABLET | Freq: Every day | ORAL | Status: AC

## 2023-11-12 NOTE — Progress Notes (Signed)
 Medication Samples have been provided to the patient.  Drug name: Biktarvy         Strength: 50/200/25 mg       Qty: 7 tablets (1 bottles) LOT: [id number]   Exp.Date: 7/27  Samples requested by Nicklas Barns, PharmD.  Dosing instructions: Take one tablet by mouth once daily  The patient has been instructed regarding the correct time, dose, and frequency of taking this medication, including desired effects and most common side effects.   Nicklas Barns, PharmD, CPP, BCIDP, AAHIVP Clinical Pharmacist Practitioner Infectious Diseases Clinical Pharmacist [facility]

## 2023-11-15 NOTE — Telephone Encounter (Signed)
 Received VM from [facility] requesting call to review lab results. Called Jonathandavid back, no answer and no voicemail set up.   Margarita Bobrowski, BSN, RN

## 2023-11-17 ENCOUNTER — Other Ambulatory Visit: Payer: Self-pay | Admitting: Pharmacist

## 2023-11-17 DIAGNOSIS — A749 Chlamydial infection, unspecified: Secondary | ICD-10-CM

## 2023-11-17 MED ORDER — DOXYCYCLINE HYCLATE 100 MG PO TABS: 100.0000 mg | ORAL_TABLET | Freq: Two times a day (BID) | ORAL | 0 refills | Status: DC

## 2023-12-09 ENCOUNTER — Ambulatory Visit (INDEPENDENT_AMBULATORY_CARE_PROVIDER_SITE_OTHER): Payer: Self-pay | Admitting: Infectious Diseases

## 2023-12-09 ENCOUNTER — Encounter: Payer: Self-pay | Admitting: Infectious Diseases

## 2023-12-09 ENCOUNTER — Other Ambulatory Visit: Payer: Self-pay

## 2023-12-09 VITALS — BP 129/77 | HR 93 | Temp 98.3°F | Ht 69.0 in | Wt 168.0 lb

## 2023-12-09 DIAGNOSIS — R1115 Cyclical vomiting syndrome unrelated to migraine: Secondary | ICD-10-CM

## 2023-12-09 DIAGNOSIS — B20 Human immunodeficiency virus [HIV] disease: Secondary | ICD-10-CM

## 2023-12-09 DIAGNOSIS — R7989 Other specified abnormal findings of blood chemistry: Secondary | ICD-10-CM

## 2023-12-09 DIAGNOSIS — K2970 Gastritis, unspecified, without bleeding: Secondary | ICD-10-CM

## 2023-12-09 MED ORDER — BIKTARVY 50-200-25 MG PO TABS: 1.0000 | ORAL_TABLET | Freq: Every day | ORAL | 5 refills | Status: AC

## 2023-12-09 NOTE — Patient Instructions (Signed)
 Can try a 2 week trial of over the counter Pepcid  to see if that helps the burning sensation. Take first thing in the morning on empty stomach or before bed on empty stomach (2 hours minimum after your last meal) to see if it helps.   Continue your other medication everyday

## 2023-12-09 NOTE — Progress Notes (Signed)
 Subjective:    CC: Matthew Petersen is a 23 y.o. male patient here today with concern over STI exposure/symptoms .    HPI: [name] is here for HIV follow up care.   ER visit reviewed recently for refractory vomiting and nausea. Felt a little better after IVF but did not resolve symptoms. Zofran  was moderately helpful. Today is the 2nd day since January 2nd of this year that he has not had any nausea or vomiting and finally feels like his appetite is coming back. He suspects these illnesses have a pattern and have happened yearly around the same time the last few years. Usually has a spell of 2 weeks with nonstop N/V. Always notices it the same time of year and seems to have a pattern with these episodes. Does have some episodes over the summer but this is infrequent. Feels that the medication when he is off of it for a period of time it can be tough on his stomach to re-introduce it and is interested in switching to alternative.  He has considered cannabis hyperemesis syndrome as well but during his experimentation with being off of this symptoms seemed to persist.  Was back on biktarvy  recently before current illness - between May through October he had no noticeable trouble aside from some low grade nausea a/w taking his medication.     Review of Systems: Review of Systems  Constitutional:  Negative for chills and fever.  HENT:  Negative for sore throat.   Eyes:  Negative for visual disturbance.  Gastrointestinal:  Negative for abdominal pain, anal bleeding and rectal pain.  Genitourinary:  Negative for dysuria, genital sores, penile discharge, penile pain, scrotal swelling and testicular pain.  Musculoskeletal:  Negative for arthralgias and joint swelling.  Skin:  Negative for rash.  Neurological:  Negative for headaches.  Hematological:  Negative for adenopathy.    Sexual History:  Prefers male partners, oral sex only Condom use none   Past Medical History:  Diagnosis  Date   HIV (human immunodeficiency virus infection) (HCC)     Outpatient Medications Prior to Visit  Medication Sig Dispense Refill   bictegravir-emtricitabine -tenofovir  AF (BIKTARVY ) 50-200-25 MG TABS tablet Take 1 tablet by mouth daily. 30 tablet 1   doxycycline  (VIBRA -TABS) 100 MG tablet Take 1 tablet (100 mg total) by mouth 2 (two) times daily. 14 tablet 0   omeprazole  (PRILOSEC) 40 MG capsule Take 1 capsule (40 mg total) by mouth daily. 30 capsule 2   No facility-administered medications prior to visit.    Allergies  Allergen Reactions   Haldol  [Haloperidol ]     Dystonic reaction       Objective:  Objective  Vitals:   12/09/23 1100  BP: 129/77  Pulse: 93  Temp: 98.3 F (36.8 C)  SpO2: 97%   Body mass index is 24.81 kg/m.   Physical Exam  Physical Exam Constitutional:      Appearance: Normal appearance. He is not ill-appearing.  HENT:     Head: Normocephalic.     Mouth/Throat:     Mouth: Mucous membranes are moist.     Pharynx: Oropharynx is clear.   Eyes:     General: No scleral icterus.   Cardiovascular:     Rate and Rhythm: Normal rate and regular rhythm.  Pulmonary:     Effort: Pulmonary effort is normal.   Musculoskeletal:        General: Normal range of motion.     Cervical back: Normal range  of motion.   Skin:    Coloration: Skin is not jaundiced or pale.   Neurological:     Mental Status: He is alert and oriented to person, place, and time.   Psychiatric:        Mood and Affect: Mood normal.        Judgment: Judgment normal.        Assessment & Plan:   Assessment and Plan    HIV Infection -  Off medications for a period of time due to nausea/vomiting and discomfort associated with that, but has been back on consistently for 1 month now. Tolerating biktarvy  welll without any concerns.  We reviewed labs collected last visit.  - Repeat VL today  - MMR titer    Gastritis - Cyclic Vomiting Syndrome -   EGD unremarkable. Most  symptoms gone aside from some intermittent upper abdominal pain, possibly due to gastritis or reflux. Symptoms improve with food intake, suggesting a link to gastric acid. No recent nausea or vomiting since the beginning of the year. - Recommend trial of over-the-counter Pepcid , to be taken first thing in the morning on an empty stomach. - Advise to continue Pepcid  for a minimum of two weeks and, if improvement is noted, extend to a full eight-week course.  Elevated liver function tests - Previous liver function tests showed elevated levels, likely due to recent alcohol consumption during celebrations. No use of Tylenol  reported. - Repeat liver function tests to assess current levels. - Update hepatitis C screening.      No orders of the defined types were placed in this encounter.  Orders Placed This Encounter  Procedures   HIV 1 RNA quant-no reflex-bld   COMPLETE METABOLIC PANEL WITHOUT GFR   Hepatitis C Antibody      Corean Fireman, MSN, NP-C [facility] Infectious [facility] Office: [phone] Pager: [phone]

## 2023-12-11 LAB — COMPLETE METABOLIC PANEL WITHOUT GFR
AG Ratio: 1.2 (calc) (ref 1.0–2.5)
ALT: 26 U/L (ref 9–46)
AST: 29 U/L (ref 10–40)
Albumin: 4.3 g/dL (ref 3.6–5.1)
Alkaline phosphatase (APISO): 52 U/L (ref 36–130)
BUN: 14 mg/dL (ref 7–25)
CO2: 27 mmol/L (ref 20–32)
Calcium: 9.3 mg/dL (ref 8.6–10.3)
Chloride: 106 mmol/L (ref 98–110)
Creat: 1.06 mg/dL (ref 0.60–1.24)
Globulin: 3.7 g/dL (ref 1.9–3.7)
Glucose, Bld: 98 mg/dL (ref 65–99)
Potassium: 4.4 mmol/L (ref 3.5–5.3)
Sodium: 138 mmol/L (ref 135–146)
Total Bilirubin: 0.7 mg/dL (ref 0.2–1.2)
Total Protein: 8 g/dL (ref 6.1–8.1)

## 2023-12-11 LAB — HIV-1 RNA QUANT-NO REFLEX-BLD
HIV 1 RNA Quant: 40 {copies}/mL — ABNORMAL HIGH
HIV-1 RNA Quant, Log: 1.6 {Log_copies}/mL — ABNORMAL HIGH

## 2023-12-11 LAB — HEPATITIS C ANTIBODY: Hepatitis C Ab: NONREACTIVE

## 2023-12-21 ENCOUNTER — Ambulatory Visit: Payer: Self-pay | Admitting: Infectious Diseases

## 2024-03-21 ENCOUNTER — Other Ambulatory Visit: Payer: Self-pay

## 2024-03-24 ENCOUNTER — Ambulatory Visit (HOSPITAL_COMMUNITY)
Admission: EM | Admit: 2024-03-24 | Discharge: 2024-03-24 | Disposition: A | Discharge status: Home / Self Care | Payer: Self-pay | Attending: Family Medicine | Admitting: Family Medicine

## 2024-03-24 ENCOUNTER — Encounter (HOSPITAL_COMMUNITY): Payer: Self-pay

## 2024-03-24 DIAGNOSIS — Z1152 Encounter for screening for COVID-19: Secondary | ICD-10-CM | POA: Insufficient documentation

## 2024-03-24 DIAGNOSIS — J069 Acute upper respiratory infection, unspecified: Secondary | ICD-10-CM | POA: Insufficient documentation

## 2024-03-24 DIAGNOSIS — Z113 Encounter for screening for infections with a predominantly sexual mode of transmission: Secondary | ICD-10-CM | POA: Insufficient documentation

## 2024-03-24 DIAGNOSIS — J029 Acute pharyngitis, unspecified: Secondary | ICD-10-CM | POA: Insufficient documentation

## 2024-03-24 LAB — POCT URINALYSIS DIP (MANUAL ENTRY)
Bilirubin, UA: NEGATIVE
Blood, UA: NEGATIVE
Glucose, UA: NEGATIVE mg/dL
Ketones, POC UA: NEGATIVE mg/dL
Leukocytes, UA: NEGATIVE
Nitrite, UA: NEGATIVE
Protein Ur, POC: 30 mg/dL — AB
Spec Grav, UA: 1.02 (ref 1.010–1.025)
Urobilinogen, UA: 2 U/dL — AB
pH, UA: 7.5 (ref 5.0–8.0)

## 2024-03-24 LAB — POC SOFIA SARS ANTIGEN FIA: SARS Coronavirus 2 Ag: NEGATIVE

## 2024-03-24 LAB — POCT RAPID STREP A (OFFICE): Rapid Strep A Screen: NEGATIVE

## 2024-03-24 NOTE — ED Triage Notes (Addendum)
 Sore throat, dry cough and headache for the last 4 days. States his nephew was sick and staying with him. Tried sudafed and cough drops with no relief.   Patient has also had a new sexual partner to requesting STD testing as well. Patient states also having pressure on his bladder. Patient requesting an oral swab for STD screening.

## 2024-03-24 NOTE — ED Provider Notes (Signed)
 [facility]    CSN: [id number] Arrival date & time: 03/24/24  0815      History   Chief Complaint Chief Complaint  Patient presents with   Sore Throat   SEXUALLY TRANSMITTED DISEASE    HPI [name] is a 23 y.o. male.    Sore Throat  Patient is here for URI symptoms x 4 days.  Having sore throat, cough, headache.  His nephew was also sick, not sure with what.  He feels feverish at night.   Using otc medications without relief.   Patient is also here for STD testing.  Some pressure to the bladder but no penile drainage noted.  He declines blood work at this time as he is already HIV positive.        Past Medical History:  Diagnosis Date   HIV (human immunodeficiency virus infection) (HCC)     Patient Active Problem List   Diagnosis Date Noted   Chronic nausea 06/30/2022   Grief 04/24/2020   Cannabis hyperemesis syndrome concurrent with and due to cannabis abuse 09/08/2018   Screening for STDs (sexually transmitted diseases) 09/08/2018   Marijuana use 08/10/2018   HIV disease (HCC) 07/21/2018   Erectile dysfunction 07/19/2018   Intractable vomiting 02/22/2018   Mood disorder of depressed type 12/29/2017   Loss of weight 12/29/2017    History reviewed. No pertinent surgical history.     Home Medications    Prior to Admission medications   Medication Sig Start Date End Date Taking? Authorizing Provider  bictegravir-emtricitabine -tenofovir  AF (BIKTARVY ) 50-200-25 MG TABS tablet Take 1 tablet by mouth daily. 12/09/23  Yes Melvenia Corean SAILOR, NP    Family History Family History  Problem Relation Age of Onset   Diabetes Mother    Multiple sclerosis Father    Colon cancer Neg Hx    Esophageal cancer Neg Hx    Rectal cancer Neg Hx    Stomach cancer Neg Hx     Social History Social History   Tobacco Use   Smoking status: Never   Smokeless tobacco: Never  Vaping Use   Vaping status: Never Used  Substance Use Topics   Alcohol  use: Yes    Comment: occasionally   Drug use: Yes    Frequency: 7.0 times per week    Types: Marijuana    Comment: last used 3.5 weeks     Allergies   Haldol  [haloperidol ]   Review of Systems Review of Systems  Constitutional:  Positive for fever.  HENT:  Positive for congestion and sore throat.   Respiratory:  Positive for cough.   Cardiovascular: Negative.   Gastrointestinal: Negative.   Genitourinary: Negative.   Musculoskeletal: Negative.   Psychiatric/Behavioral: Negative.       Physical Exam Triage Vital Signs ED Triage Vitals  Encounter Vitals Group     BP 03/24/24 0836 120/79     Girls Systolic BP Percentile --      Girls Diastolic BP Percentile --      Boys Systolic BP Percentile --      Boys Diastolic BP Percentile --      Pulse Rate 03/24/24 0836 (!) 112     Resp 03/24/24 0836 18     Temp 03/24/24 0836 98.3 F (36.8 C)     Temp Source 03/24/24 0836 Oral     SpO2 03/24/24 0836 98 %     Weight --      Height --      Head Circumference --  Peak Flow --      Pain Score 03/24/24 0834 0     Pain Loc --      Pain Education --      Exclude from Growth Chart --    No data found.  Updated Vital Signs BP 120/79 (BP Location: Left Arm)   Pulse (!) 112   Temp 98.3 F (36.8 C) (Oral)   Resp 18   SpO2 98%   Visual Acuity Right Eye Distance:   Left Eye Distance:   Bilateral Distance:    Right Eye Near:   Left Eye Near:    Bilateral Near:     Physical Exam Constitutional:      General: He is not in acute distress.    Appearance: He is well-developed and normal weight. He is not ill-appearing or toxic-appearing.  HENT:     Nose: No congestion or rhinorrhea.     Mouth/Throat:     Mouth: Mucous membranes are moist.     Pharynx: Posterior oropharyngeal erythema present. No oropharyngeal exudate.     Tonsils: No tonsillar exudate.  Cardiovascular:     Rate and Rhythm: Normal rate and regular rhythm.     Heart sounds: Normal heart sounds.   Pulmonary:     Effort: Pulmonary effort is normal.     Breath sounds: Normal breath sounds.  Musculoskeletal:     Cervical back: Normal range of motion and neck supple.  Lymphadenopathy:     Cervical: No cervical adenopathy.  Neurological:     General: No focal deficit present.     Mental Status: He is alert.  Psychiatric:        Mood and Affect: Mood normal.      UC Treatments / Results  Labs (all labs ordered are listed, but only abnormal results are displayed) Labs Reviewed  POCT URINALYSIS DIP (MANUAL ENTRY) - Abnormal; Notable for the following components:      Result Value   Protein Ur, POC =30 (*)    Urobilinogen, UA 2.0 (*)    All other components within normal limits  POCT RAPID STREP A (OFFICE) - Normal  CULTURE, GROUP A STREP ([facility])  POC SOFIA SARS ANTIGEN FIA  CYTOLOGY, (ORAL, ANAL, URETHRAL) ANCILLARY ONLY  CYTOLOGY, (ORAL, ANAL, URETHRAL) ANCILLARY ONLY    EKG   Radiology No results found.  Procedures Procedures (including critical care time)  Medications Ordered in UC Medications - No data to display  Initial Impression / Assessment and Plan / UC Course  I have reviewed the triage vital signs and the nursing notes.  Pertinent labs & imaging results that were available during my care of the patient were reviewed by me and considered in my medical decision making (see chart for details).   Final Clinical Impressions(s) / UC Diagnoses   Final diagnoses:  Encounter for screening for COVID-19  Sore throat  Viral URI  Screening for STD (sexually transmitted disease)     Discharge Instructions      You were seen today for various issues.  Your strep test and covid swab was negative.  This is likely a viral illness.  I will send the throat swab to the lab for culture.  If this comes back positive you will be notified.  In the mean time I recommend tylenol /motrin  for fever or pain, and warm salt water  gargles.  Please return if not improving.   Your urine looks normal today.  Your STD swabs will be resulted over the weekend.  You  will see these results on mychart and you will be notified if anything is positive.  Please call us  if you have not received a call over the weekend or if you have questions about your results.     ED Prescriptions   None    PDMP not reviewed this encounter.   Darral Longs, MD 03/24/24 [phone]

## 2024-03-24 NOTE — Discharge Instructions (Addendum)
 You were seen today for various issues.  Your strep test and covid swab was negative.  This is likely a viral illness.  I will send the throat swab to the lab for culture.  If this comes back positive you will be notified.  In the mean time I recommend tylenol /motrin  for fever or pain, and warm salt water  gargles.  Please return if not improving.  Your urine looks normal today.  Your STD swabs will be resulted over the weekend.  You will see these results on mychart and you will be notified if anything is positive.  Please call us  if you have not received a call over the weekend or if you have questions about your results.

## 2024-03-26 LAB — CULTURE, GROUP A STREP (THRC)

## 2024-03-27 ENCOUNTER — Ambulatory Visit (HOSPITAL_COMMUNITY): Payer: Self-pay

## 2024-03-27 LAB — CYTOLOGY, (ORAL, ANAL, URETHRAL) ANCILLARY ONLY
Chlamydia: NEGATIVE
Chlamydia: NEGATIVE
Comment: NEGATIVE
Comment: NORMAL
Comment: NEGATIVE
Comment: NEGATIVE
Comment: NORMAL
Neisseria Gonorrhea: NEGATIVE
Neisseria Gonorrhea: NEGATIVE
Trichomonas: NEGATIVE

## 2024-05-28 ENCOUNTER — Other Ambulatory Visit: Payer: Self-pay

## 2024-05-28 ENCOUNTER — Encounter (HOSPITAL_COMMUNITY): Payer: Self-pay

## 2024-05-28 ENCOUNTER — Emergency Department (HOSPITAL_COMMUNITY)
Admission: EM | Admit: 2024-05-28 | Discharge: 2024-05-28 | Disposition: A | Discharge status: Home / Self Care | Payer: Self-pay | Attending: Emergency Medicine | Admitting: Emergency Medicine

## 2024-05-28 DIAGNOSIS — R000 Tachycardia, unspecified: Secondary | ICD-10-CM | POA: Insufficient documentation

## 2024-05-28 DIAGNOSIS — E878 Other disorders of electrolyte and fluid balance, not elsewhere classified: Secondary | ICD-10-CM | POA: Insufficient documentation

## 2024-05-28 DIAGNOSIS — Z79899 Other long term (current) drug therapy: Secondary | ICD-10-CM | POA: Insufficient documentation

## 2024-05-28 DIAGNOSIS — R1084 Generalized abdominal pain: Secondary | ICD-10-CM | POA: Insufficient documentation

## 2024-05-28 DIAGNOSIS — R509 Fever, unspecified: Secondary | ICD-10-CM | POA: Insufficient documentation

## 2024-05-28 DIAGNOSIS — Z21 Asymptomatic human immunodeficiency virus [HIV] infection status: Secondary | ICD-10-CM | POA: Insufficient documentation

## 2024-05-28 DIAGNOSIS — E860 Dehydration: Secondary | ICD-10-CM | POA: Insufficient documentation

## 2024-05-28 DIAGNOSIS — E876 Hypokalemia: Secondary | ICD-10-CM | POA: Insufficient documentation

## 2024-05-28 DIAGNOSIS — R1116 Cannabis hyperemesis syndrome: Secondary | ICD-10-CM | POA: Insufficient documentation

## 2024-05-28 LAB — CBC WITH DIFFERENTIAL/PLATELET
Abs Immature Granulocytes: 0.05 K/uL (ref 0.00–0.07)
Basophils Absolute: 0 K/uL (ref 0.0–0.1)
Basophils Relative: 0 %
Eosinophils Absolute: 0 K/uL (ref 0.0–0.5)
Eosinophils Relative: 0 %
HCT: 45.9 % (ref 39.0–52.0)
Hemoglobin: 15.1 g/dL (ref 13.0–17.0)
Immature Granulocytes: 1 %
Lymphocytes Relative: 32 %
Lymphs Abs: 2.5 K/uL (ref 0.7–4.0)
MCH: 25.8 pg — ABNORMAL LOW (ref 26.0–34.0)
MCHC: 32.9 g/dL (ref 30.0–36.0)
MCV: 78.5 fL — ABNORMAL LOW (ref 80.0–100.0)
Monocytes Absolute: 1.1 K/uL — ABNORMAL HIGH (ref 0.1–1.0)
Monocytes Relative: 14 %
Neutro Abs: 4.2 K/uL (ref 1.7–7.7)
Neutrophils Relative %: 53 %
Platelets: 275 K/uL (ref 150–400)
RBC: 5.85 MIL/uL — ABNORMAL HIGH (ref 4.22–5.81)
RDW: 12.3 % (ref 11.5–15.5)
WBC: 7.8 K/uL (ref 4.0–10.5)
nRBC: 0 % (ref 0.0–0.2)

## 2024-05-28 LAB — URINALYSIS, ROUTINE W REFLEX MICROSCOPIC
Bacteria, UA: NONE SEEN
Bilirubin Urine: NEGATIVE
Glucose, UA: NEGATIVE mg/dL
Ketones, ur: 5 mg/dL — AB
Leukocytes,Ua: NEGATIVE
Nitrite: NEGATIVE
Protein, ur: 100 mg/dL — AB
Specific Gravity, Urine: 1.025 (ref 1.005–1.030)
pH: 5 (ref 5.0–8.0)

## 2024-05-28 LAB — RAPID URINE DRUG SCREEN, HOSP PERFORMED
Amphetamines: NOT DETECTED
Barbiturates: NOT DETECTED
Benzodiazepines: NOT DETECTED
Cocaine: NOT DETECTED
Opiates: NOT DETECTED
Tetrahydrocannabinol: POSITIVE — AB

## 2024-05-28 LAB — COMPREHENSIVE METABOLIC PANEL WITH GFR
ALT: 33 U/L (ref 0–44)
AST: 31 U/L (ref 15–41)
Albumin: 3.8 g/dL (ref 3.5–5.0)
Alkaline Phosphatase: 55 U/L (ref 38–126)
Anion gap: 17 — ABNORMAL HIGH (ref 5–15)
BUN: 13 mg/dL (ref 6–20)
CO2: 30 mmol/L (ref 22–32)
Calcium: 9.3 mg/dL (ref 8.9–10.3)
Chloride: 84 mmol/L — ABNORMAL LOW (ref 98–111)
Creatinine, Ser: 1.18 mg/dL (ref 0.61–1.24)
GFR, Estimated: >60 mL/min (ref >60)
Glucose, Bld: 115 mg/dL — ABNORMAL HIGH (ref 70–99)
Potassium: 2.7 mmol/L — CL (ref 3.5–5.1)
Sodium: 131 mmol/L — ABNORMAL LOW (ref 135–145)
Total Bilirubin: 1.4 mg/dL — ABNORMAL HIGH (ref 0.0–1.2)
Total Protein: 8.4 g/dL — ABNORMAL HIGH (ref 6.5–8.1)

## 2024-05-28 LAB — RESP PANEL BY RT-PCR (RSV, FLU A&B, COVID)  RVPGX2
Influenza A by PCR: NEGATIVE
Influenza B by PCR: NEGATIVE
Resp Syncytial Virus by PCR: NEGATIVE
SARS Coronavirus 2 by RT PCR: NEGATIVE

## 2024-05-28 LAB — MAGNESIUM: Magnesium: 2.4 mg/dL (ref 1.7–2.4)

## 2024-05-28 LAB — LIPASE, BLOOD: Lipase: 24 U/L (ref 11–51)

## 2024-05-28 MED ORDER — POTASSIUM CHLORIDE CRYS ER 20 MEQ PO TBCR
20.0000 meq | EXTENDED_RELEASE_TABLET | Freq: Once | ORAL | Status: AC
  Administered 2024-05-28: 20 meq via ORAL
  Filled 2024-05-28: qty 1

## 2024-05-28 MED ORDER — LACTATED RINGERS IV BOLUS
1000.0000 mL | Freq: Once | INTRAVENOUS | Status: AC
  Administered 2024-05-28: 1000 mL via INTRAVENOUS

## 2024-05-28 MED ORDER — POTASSIUM CHLORIDE 10 MEQ/100ML IV SOLN
10.0000 meq | Freq: Once | INTRAVENOUS | Status: DC
  Filled 2024-05-28: qty 100

## 2024-05-28 MED ORDER — POTASSIUM CHLORIDE 10 MEQ/100ML IV SOLN
10.0000 meq | Freq: Once | INTRAVENOUS | Status: AC
  Administered 2024-05-28: 10 meq via INTRAVENOUS
  Filled 2024-05-28: qty 100

## 2024-05-28 MED ORDER — POTASSIUM CHLORIDE CRYS ER 20 MEQ PO TBCR: 20.0000 meq | EXTENDED_RELEASE_TABLET | Freq: Two times a day (BID) | ORAL | 0 refills | Status: AC

## 2024-05-28 MED ORDER — DROPERIDOL 2.5 MG/ML IJ SOLN
1.2500 mg | Freq: Once | INTRAMUSCULAR | Status: AC
  Administered 2024-05-28: 1.25 mg via INTRAVENOUS
  Filled 2024-05-28: qty 2

## 2024-05-28 NOTE — ED Triage Notes (Signed)
 Pt c/o N/Vx1wk. Pt states vomited over 10 times in past 24 hrs. PT states unable to keep anything down.

## 2024-05-28 NOTE — Discharge Instructions (Signed)
 Begin taking potassium as prescribed.  Stay hydrated and drink plenty of fluids over the next few days.  Avoid marijuana use as much as you can as this can make symptoms worse.  Return to ED if any symptoms worsen including severe abdominal pain, uncontrollable nausea/vomiting, chest pain, difficulty breathing.

## 2024-05-28 NOTE — ED Provider Triage Note (Signed)
 Emergency Medicine Provider Triage Evaluation Note  Matthew Petersen , a 23 y.o. male  was evaluated in triage.  Pt complains of nausea and vomiting.  Patient reports he has been vomiting for the last 1 week and is unable to keep any food or water  down.  Patient reports this happens to him every so often and he comes to the emergency department for IV hydration.  He has not had a bowel movement in the last week, but is passing flatus.  Patient also reports significant abdominal pain especially when he has these episodes of emesis.  No urinary complaints.  Patient denies alcohol use.  Patient does report occasional marijuana use, but not recently.  Review of Systems  Positive: Nausea and vomiting Negative:   Physical Exam  BP 126/87   Pulse 80   Temp 99.7 F (37.6 C)   Resp 18   Ht 5' 8 (1.727 m)   Wt 68 kg   SpO2 98%   BMI 22.81 kg/m  Gen:   Awake, no distress   Resp:  Normal effort  MSK:   Moves extremities without difficulty  Other:  Patient does have abdominal tenderness to palpation, worse in the upper left quadrant.  Medical Decision Making  Medically screening exam initiated at 1:35 PM.  Appropriate orders placed.  Matthew Petersen was informed that the remainder of the evaluation will be completed by another provider, this initial triage assessment does not replace that evaluation, and the importance of remaining in the ED until their evaluation is complete.  Basic labs, UA, UDS ordered.   Matthew Marry RAMAN, PA-C 05/28/24 1337

## 2024-05-28 NOTE — ED Provider Notes (Signed)
 [place] EMERGENCY DEPARTMENT AT [facility] Provider Note   CSN: [id number] Arrival date & time: 05/28/24  1318     Patient presents with: No chief complaint on file.   Matthew Petersen is a 22 y.o. male.  Patient is a 23 year old male with a history of HIV, and cannabinoid hyperemesis syndrome who presents to the ED for increasing nausea/vomiting and generalized abdominal pain for the past 7 days.  Also notes he has not had a bowel movement in 7 days.  He states he has been trying to eat or drink but has not been able to keep anything down.  Notes he normally uses marijuana daily but denies use in approximately 2 weeks.  Also notes alcohol use 3-4 times a week use but has not used in approximately 2 weeks.  Denies sick contacts.  Notes subjective fever/chills.  Denies headache, dizziness, syncope, chest pain, shortness of breath, diarrhea, urinary symptoms.  States he has had issues like this intermittently for years and has had an endoscopy performed approximately a year ago but has not followed up recently.   HPI     Prior to Admission medications  Medication Sig Start Date End Date Taking? Authorizing Provider  potassium chloride  SA (KLOR-CON  M) 20 MEQ tablet Take 1 tablet (20 mEq total) by mouth 2 (two) times daily. 05/28/24 06/02/24 Yes Akia Montalban, Thersia RAMAN, PA-C  bictegravir-emtricitabine -tenofovir  AF (BIKTARVY ) 50-200-25 MG TABS tablet Take 1 tablet by mouth daily. 12/09/23   Melvenia Corean SAILOR, NP    Allergies: Haldol  [haloperidol ]    Review of Systems  Constitutional:  Positive for chills and fever.  Respiratory:  Negative for shortness of breath.   Cardiovascular:  Negative for chest pain.  Gastrointestinal:  Positive for abdominal pain, constipation, nausea and vomiting. Negative for diarrhea.  All other systems reviewed and are negative.   Updated Vital Signs BP 110/75   Pulse 100   Temp 99.7 F (37.6 C) (Oral)   Resp 13   Ht 5' 8 (1.727 m)   Wt 68  kg   SpO2 100%   BMI 22.81 kg/m   Physical Exam Constitutional:      Appearance: Normal appearance.  HENT:     Head: Normocephalic and atraumatic.     Mouth/Throat:     Mouth: Mucous membranes are moist.     Pharynx: Oropharynx is clear.  Cardiovascular:     Rate and Rhythm: Normal rate.  Pulmonary:     Effort: Pulmonary effort is normal.     Breath sounds: Normal breath sounds.  Abdominal:     General: Bowel sounds are normal.     Palpations: Abdomen is soft.     Tenderness: There is abdominal tenderness.     Comments: Mild generalized tenderness.  No masses, distention, guarding.  Skin:    General: Skin is warm and dry.  Neurological:     Mental Status: He is alert and oriented to person, place, and time.  Psychiatric:        Mood and Affect: Mood normal.        Behavior: Behavior normal.     (all labs ordered are listed, but only abnormal results are displayed) Labs Reviewed  CBC WITH DIFFERENTIAL/PLATELET - Abnormal; Notable for the following components:      Result Value   RBC 5.85 (*)    MCV 78.5 (*)    MCH 25.8 (*)    Monocytes Absolute 1.1 (*)    All other components within normal limits  COMPREHENSIVE METABOLIC PANEL WITH GFR - Abnormal; Notable for the following components:   Sodium 131 (*)    Potassium 2.7 (*)    Chloride 84 (*)    Glucose, Bld 115 (*)    Total Protein 8.4 (*)    Total Bilirubin 1.4 (*)    Anion gap 17 (*)    All other components within normal limits  URINALYSIS, ROUTINE W REFLEX MICROSCOPIC - Abnormal; Notable for the following components:   Color, Urine AMBER (*)    APPearance HAZY (*)    Hgb urine dipstick SMALL (*)    Ketones, ur 5 (*)    Protein, ur 100 (*)    All other components within normal limits  RAPID URINE DRUG SCREEN, HOSP PERFORMED - Abnormal; Notable for the following components:   Tetrahydrocannabinol POSITIVE (*)    All other components within normal limits  RESP PANEL BY RT-PCR (RSV, FLU A&B, COVID)  RVPGX2   LIPASE, BLOOD  MAGNESIUM  BASIC METABOLIC PANEL WITH GFR    EKG: EKG Interpretation Date/Time:  Sunday May 28 2024 16:08:22 EST Ventricular Rate:  106 PR Interval:  142 QRS Duration:  99 QT Interval:  417 QTC Calculation: 554 R Axis:   79  Text Interpretation: Sinus tachycardia Right atrial enlargement Prolonged QT interval Confirmed by Cottie Cough [phone]) on 05/28/2024 4:11:25 PM  Radiology: No results found.    Medications Ordered in the ED  droperidol  (INAPSINE ) 2.5 MG/ML injection 1.25 mg (1.25 mg Intravenous Given 05/28/24 1529)  lactated ringers  bolus 1,000 mL (0 mLs Intravenous Stopped 05/28/24 1724)  potassium chloride  10 mEq in 100 mL IVPB (0 mEq Intravenous Stopped 05/28/24 1644)  potassium chloride  SA (KLOR-CON  M) CR tablet 20 mEq (20 mEq Oral Given 05/28/24 1721)                                    Medical Decision Making Patient is a 23 year old male with a history of HIV and cannabinoid hyperemesis syndrome who presents to the ED for increasing nausea/vomiting for the past 7 days.  Also notes intermittent abdominal pain.  Please see detailed HPI above.  On exam patient is alert and in no acute distress.  Physical exam as noted above.  Mild generalized abdominal tenderness with no localization noted.  Labs significant for hypokalemia of 2.7 and mild hyponatremia 131.  Urine is positive for cannabinoids.  Also concerning for mild dehydration with ketones present.  EKG reviewed that shows sinus tachycardia with a rate of 106 and a prolonged QT interval is noted.  Suspect patient's symptoms are secondary to cannabinoid hyperemesis.  Less concerns for acute abdomen as localization is not present and no leukocytosis noted.  Patient given a liter of fluids, IV potassium, and droperidol  and states he is feeling much better.  He was able to tolerate potassium and oral fluids while in ED with no difficulties.  Heart rate has remained around 100 while in ED but also  secondary to dehydration.  I did offer another liter of fluids to the patient as well as repeat BMP to recheck potassium to make sure it is trending in the right direction as this was critically low.  However there were thankfully no EKG changes associated.  Patient states he feels much better and he does not want any more blood work this evening.  States he would like to return home.  Risks discussed with patient regarding his lab  workup and he verbalizes understanding.  He is of sound mind to assume risk and make his own decisions.  He is he is stable for discharge today.  Patient prescribed oral potassium for the next few days.  Advised to increase fluid intake.  Also advised to refrain from marijuana use is much as possible as this is most likely making symptoms worse.  He understands plan and is agreeable.  Strict return precautions provided for worsening symptoms.  Amount and/or Complexity of Data Reviewed Labs: ordered.  Risk Prescription drug management.       Final diagnoses:  Cannabinoid hyperemesis syndrome  Hypokalemia    ED Discharge Orders          Ordered    potassium chloride  SA (KLOR-CON  M) 20 MEQ tablet  2 times daily        05/28/24 1749               Neysa Thersia RAMAN, [place] 05/28/24 1823    Cottie Donnice PARAS, MD 05/28/24 [phone]

## 2024-06-22 ENCOUNTER — Ambulatory Visit: Payer: Self-pay | Admitting: Infectious Diseases

## 2024-06-22 ENCOUNTER — Telehealth: Payer: Self-pay

## 2024-06-22 ENCOUNTER — Other Ambulatory Visit (HOSPITAL_COMMUNITY): Payer: Self-pay

## 2024-06-22 NOTE — Telephone Encounter (Signed)
 [facility] Patient Advocate Encounter  Insurance verification completed.    The patient is uninsured and will need patient assistance for medication.  We can complete the application and will need to meet with the patient for signatures and income documentation.
